# Patient Record
Sex: Male | Born: 1995
Health system: Southern US, Community
[De-identification: ages and names within clinical notes are randomized; demographics above are authoritative.]

## PROBLEM LIST (undated history)

## (undated) DIAGNOSIS — E079 Disorder of thyroid, unspecified: Secondary | ICD-10-CM

## (undated) DIAGNOSIS — J45909 Unspecified asthma, uncomplicated: Secondary | ICD-10-CM

## (undated) DIAGNOSIS — F329 Major depressive disorder, single episode, unspecified: Secondary | ICD-10-CM

## (undated) DIAGNOSIS — J3089 Other allergic rhinitis: Secondary | ICD-10-CM

## (undated) DIAGNOSIS — I861 Scrotal varices: Secondary | ICD-10-CM

## (undated) DIAGNOSIS — F32A Depression, unspecified: Secondary | ICD-10-CM

## (undated) DIAGNOSIS — Z8709 Personal history of other diseases of the respiratory system: Secondary | ICD-10-CM

## (undated) HISTORY — PX: WISDOM TOOTH EXTRACTION: SHX21

## (undated) HISTORY — DX: Scrotal varices: I86.1

## (undated) HISTORY — DX: Pure hyperglyceridemia: E78.1

## (undated) HISTORY — PX: TONSILLECTOMY: SUR1361

## (undated) HISTORY — PX: ADENOIDECTOMY: SUR15

## (undated) HISTORY — DX: Depression, unspecified: F32.A

## (undated) HISTORY — DX: Major depressive disorder, single episode, unspecified: F32.9

## (undated) HISTORY — DX: Other allergic rhinitis: J30.89

## (undated) HISTORY — DX: Unspecified asthma, uncomplicated: J45.909

## (undated) HISTORY — DX: Personal history of other diseases of the respiratory system: Z87.09

## (undated) HISTORY — DX: Disorder of thyroid, unspecified: E07.9

---
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
## 2016-05-29 ENCOUNTER — Other Ambulatory Visit (HOSPITAL_COMMUNITY): Payer: Self-pay | Admitting: Family Medicine

## 2016-05-29 DIAGNOSIS — N5082 Scrotal pain: Secondary | ICD-10-CM

## 2016-05-31 ENCOUNTER — Ambulatory Visit (HOSPITAL_COMMUNITY)
Admission: RE | Admit: 2016-05-31 | Discharge: 2016-05-31 | Disposition: A | Discharge status: Home / Self Care | Payer: BC Managed Care – PPO | Source: Ambulatory Visit | Attending: Family Medicine | Admitting: Family Medicine

## 2016-05-31 DIAGNOSIS — N5082 Scrotal pain: Secondary | ICD-10-CM | POA: Insufficient documentation

## 2017-01-15 IMAGING — US US ART/VEN ABD/PELV/SCROTUM DOPPLER LTD
1 series · 14 of 25 positions shown · non-contrast
Comparison: None.

CLINICAL DATA: Left scrotal pain.

EXAM:
SCROTAL ULTRASOUND
DOPPLER ULTRASOUND OF THE TESTICLES
TECHNIQUE: Complete ultrasound examination of the testicles, epididymis, and
other scrotal structures was performed. Color and spectral Doppler
ultrasound were also utilized to evaluate blood flow to the
testicles.

[Series 1: us art/ven abd/pelv/scrotum doppler ltd · 0.06mm/px · 14 of 68 slices shown]
[im 1/68]
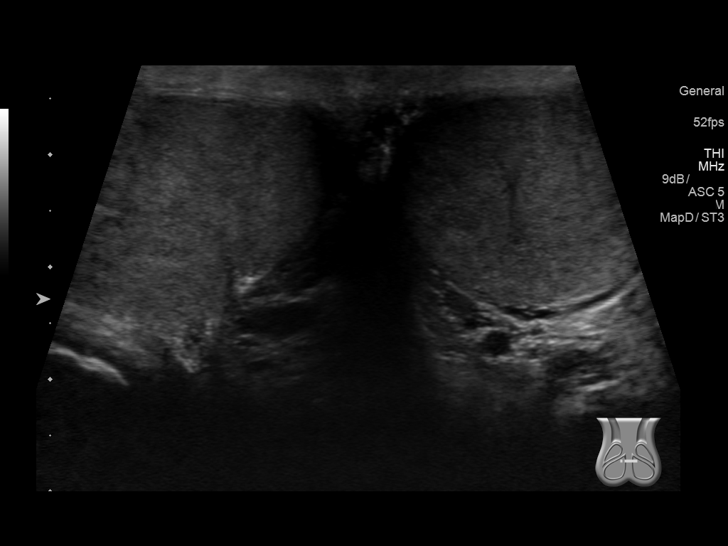
[im 6/68]
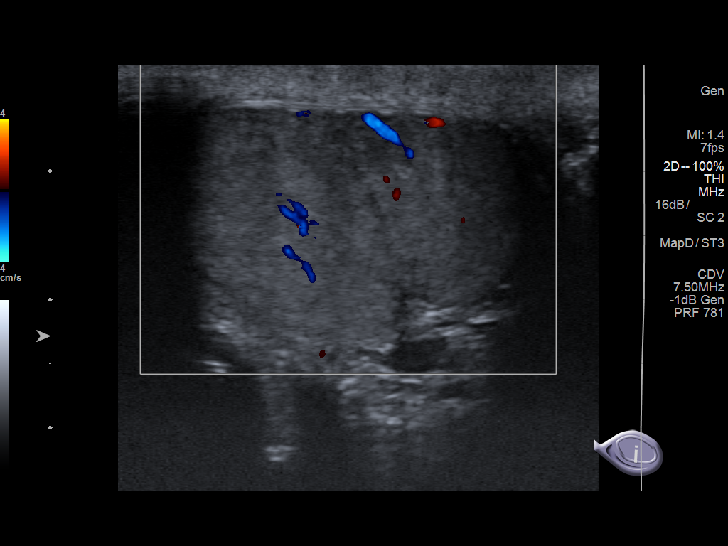
[im 12/68]
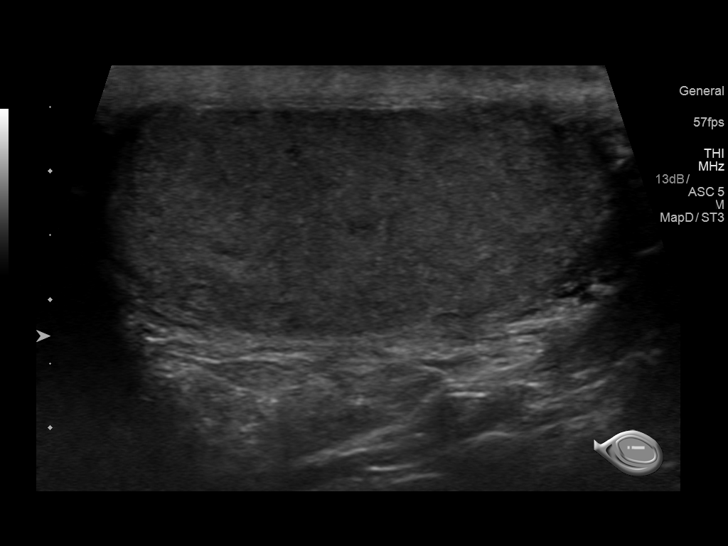
[im 17/68]
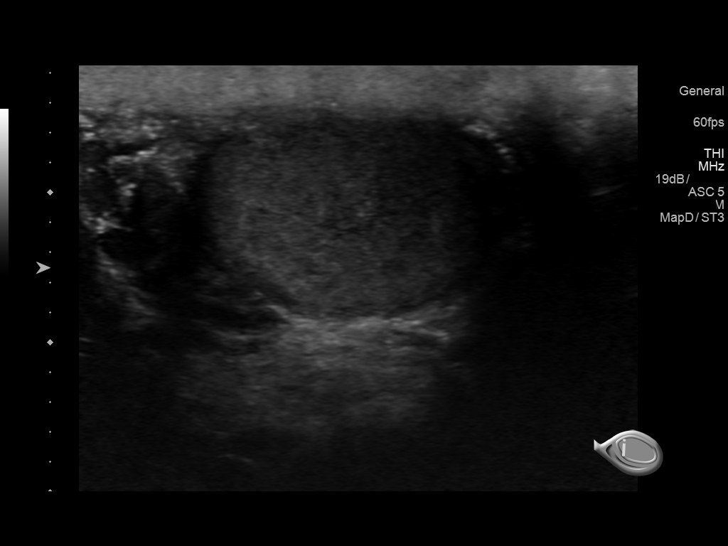
[im 23/68]
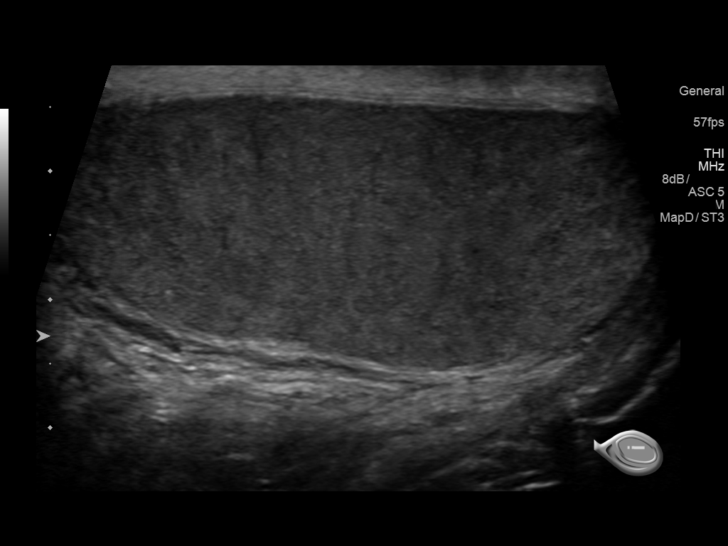
[im 26/68]
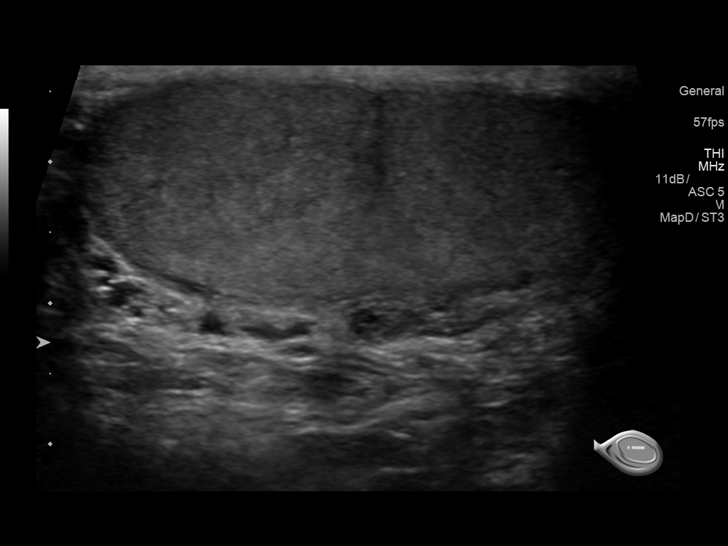
[im 31/68]
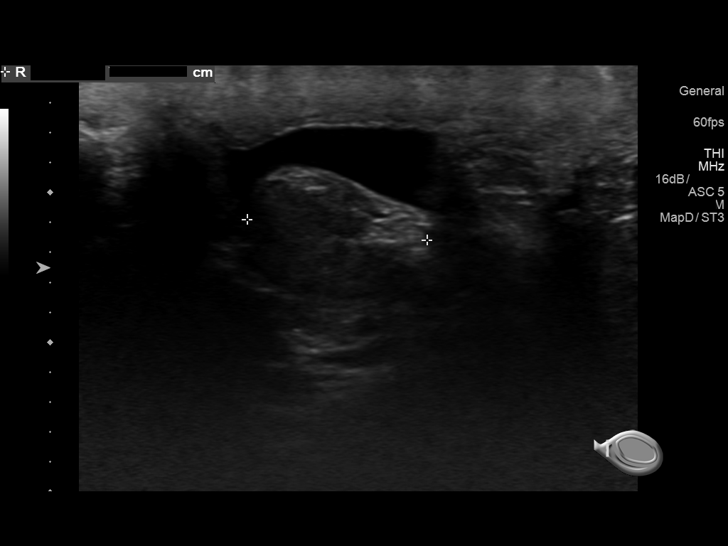
[im 37/68]
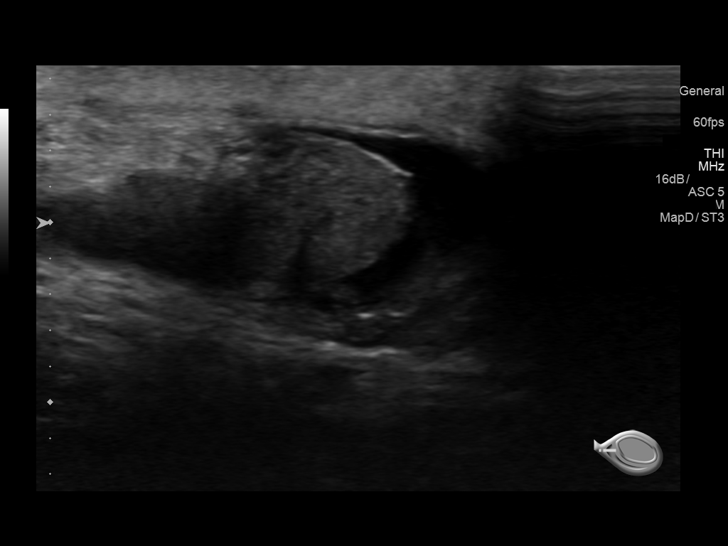
[im 42/68]
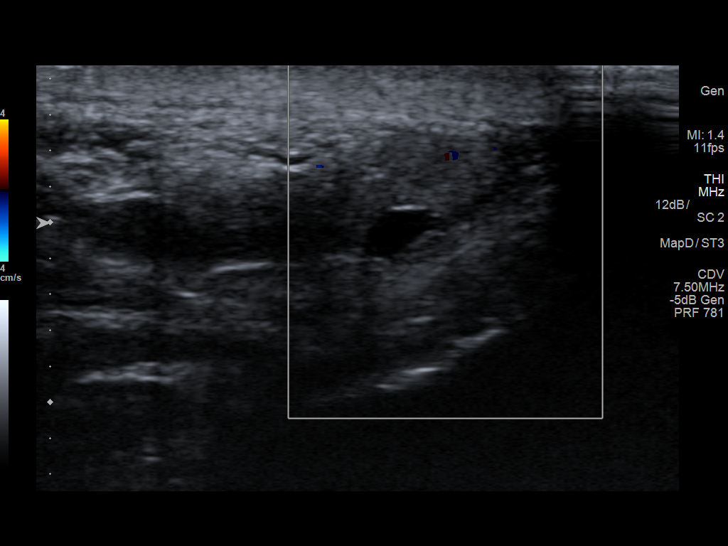
[im 45/68]
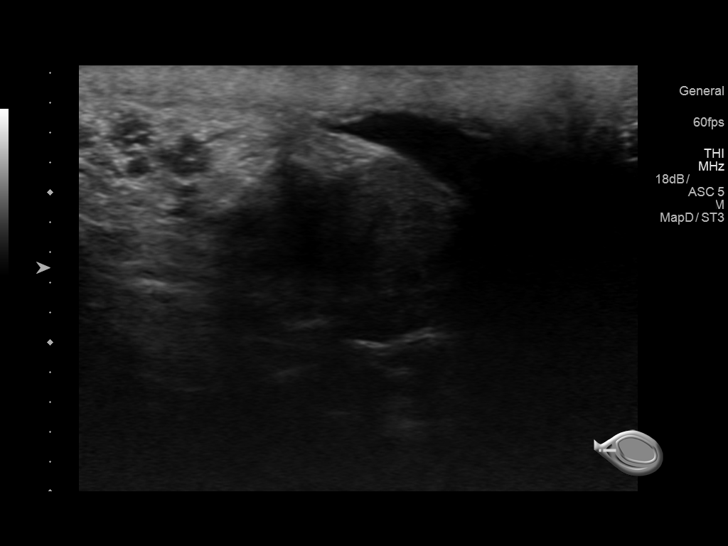
[im 51/68]
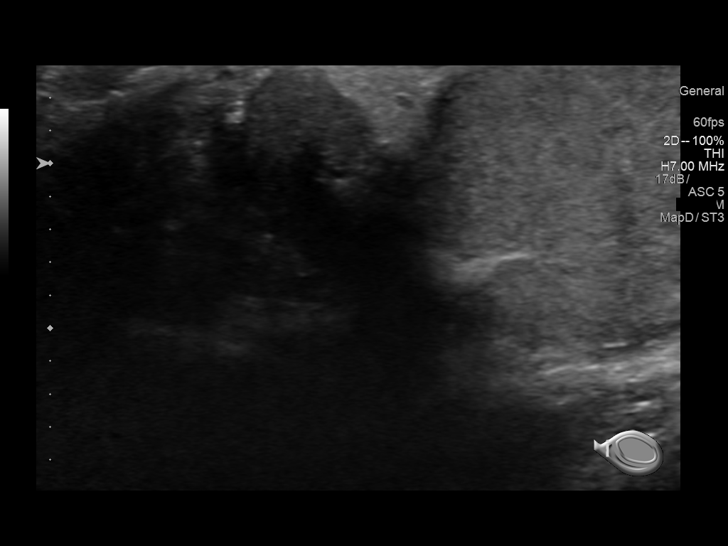
[im 56/68]
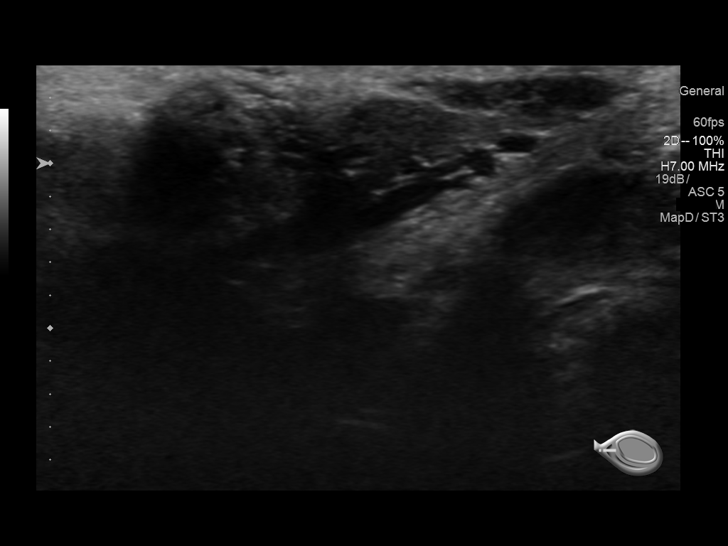
[im 62/68]
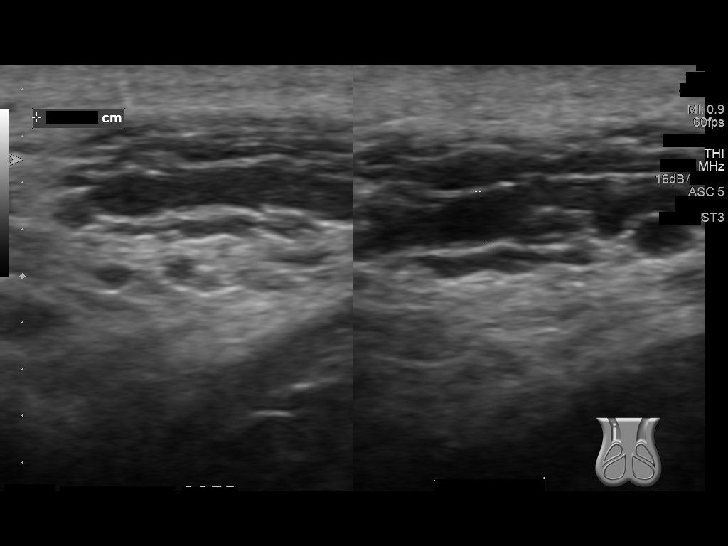
[im 68/68]
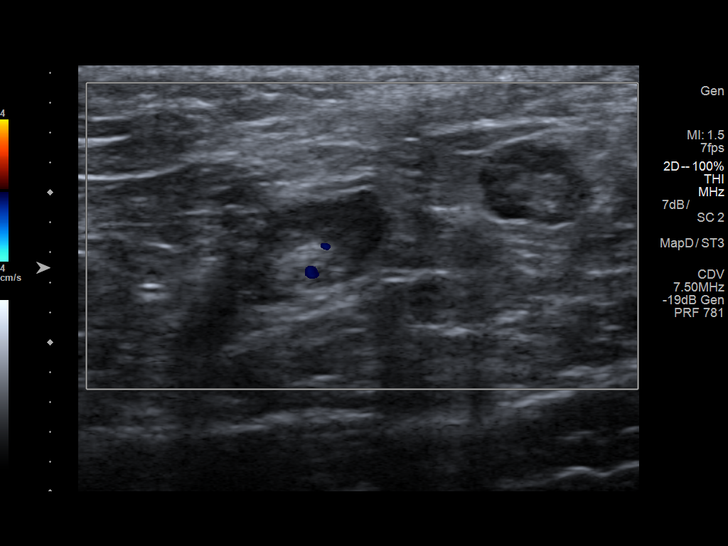

[14 of 25 positions shown; findings below may reference images not displayed]

FINDINGS: Right testicle

Measurements: 4.5 x 1.9 x 2.7 cm. No mass or microlithiasis
visualized.

Left testicle

Measurements: 4.7 x 2.1 x 2.8 cm. No mass or microlithiasis
visualized.

Right epididymis:  3 mm cyst.

Left epididymis:  Normal in size and appearance.

Hydrocele:  None visualized.

Varicocele:  Small left varicocele cannot be excluded.

Pulsed Doppler interrogation of both testes demonstrates normal low
resistance arterial and venous waveforms bilaterally.

Small left inguinal lymph nodes are noted.  Largest measures 1 cm .
IMPRESSION: 1.  Small left varicocele cannot be excluded.

2. No evidence of testicular mass or torsion.

3. Small left inguinal lymph nodes are noted. Largest measures 1 cm.

## 2017-11-21 ENCOUNTER — Encounter: Payer: Self-pay | Admitting: Family Medicine

## 2017-11-21 ENCOUNTER — Ambulatory Visit: Payer: BC Managed Care – PPO | Admitting: Family Medicine

## 2017-11-21 VITALS — BP 120/80 | HR 62 | Wt 218.4 lb

## 2017-11-21 DIAGNOSIS — Z8709 Personal history of other diseases of the respiratory system: Secondary | ICD-10-CM | POA: Diagnosis not present

## 2017-11-21 DIAGNOSIS — J3089 Other allergic rhinitis: Secondary | ICD-10-CM

## 2017-11-21 DIAGNOSIS — F329 Major depressive disorder, single episode, unspecified: Secondary | ICD-10-CM | POA: Diagnosis not present

## 2017-11-21 DIAGNOSIS — J4520 Mild intermittent asthma, uncomplicated: Secondary | ICD-10-CM | POA: Insufficient documentation

## 2017-11-21 DIAGNOSIS — F32A Depression, unspecified: Secondary | ICD-10-CM | POA: Insufficient documentation

## 2017-11-21 DIAGNOSIS — E079 Disorder of thyroid, unspecified: Secondary | ICD-10-CM | POA: Insufficient documentation

## 2017-11-21 DIAGNOSIS — I861 Scrotal varices: Secondary | ICD-10-CM | POA: Insufficient documentation

## 2017-11-21 DIAGNOSIS — Z79899 Other long term (current) drug therapy: Secondary | ICD-10-CM

## 2017-11-21 HISTORY — DX: Personal history of other diseases of the respiratory system: Z87.09

## 2017-11-21 HISTORY — DX: Scrotal varices: I86.1

## 2017-11-21 NOTE — Progress Notes (Signed)
Subjective:    Patient ID: Walter Spears, male    DOB: [birth year], 22 y.o.   MRN: [id number]  HPI Chief Complaint  Patient presents with  . new pt    new pt thryoid levels low and was put on med but no follow-up since   He is new to the practice and here to establish care.  Previous medical care: [facility] in [place]. Dr. Hilma Favors  Works in [place] and is moving from [place].  He is working 2 jobs and going to school but will graduate soon. He is an Chief Financial Officer. Single.   States he has been on Levothyroxine since May 2018. Requests recheck of thyroid function today.  States he was told his thyroid level was "low".  Reports being on same dose for the past 8 months and he is unsure of the dose. States he rarely misses doses. Takes medication with a full glass of water on an empty stomach.   Reports having ongoing tenderness to his right posterior neck at the base of his skull. This has been constant for at least one year without worsening.   Reports history of underlying allergies- takes Singulair and has since age 75. Reports year round allergies that are worse in the spring.  History of asthma since age 9 or 35. Well controlled. No albuterol inhaler in 3 years. No flare ups.  History of recurrent sinus infection. Last one was 3 weeks ago. States he gets 3 per year.  No ENT in the past.   History of depression, questionable anxiety. States he has been taking Lexapro for 4 years. Has sexual side effects but has tried to wean off the medication and his depression got worse.  Has had thoughts of suicide but never had a plan or attempted.  States he has never seen a therapist.  Reports his mother died with brain cancer and she never had any symptoms prior to her death. He is concerned and has been trying to get his father to get her biopsy reports but his father will not agree to this. He is frustrated about this.   Left testicle varicocele.   History of left hip pain.  States he had an [place]. No hernia. No XR of hip.  Right elbow with intermittent severe pain. Has been told he might need surgery and this is probably related to playing football in the past.  Orthopedist [facility] thinks this was in the [place] and [employer]. He was there 6 years ago for shoulder issues.   Smokes marijuana occasionally.  Drinks beer occasionally, mostly on the weekends.    Depression screen PHQ 2/9 11/21/2017  Decreased Interest 0  Down, Depressed, Hopeless 0  PHQ - 2 Score 0    Reviewed allergies, medications, past medical, surgical, family, and social history.    Review of Systems Pertinent positives and negatives in the history of present illness.     Objective:   Physical Exam BP 120/80   Pulse 62   Wt 218 lb 6.4 oz (99.1 kg)  Alert and in no distress. No sinus tenderness.  Pharyngeal area is normal. Neck is supple without adenopathy or thyromegaly. Cardiac exam shows a regular sinus rhythm without murmurs or gallops. Lungs are clear to auscultation.      Assessment & Plan:  Thyroid disease - Plan: CBC with Differential/Platelet, Comprehensive metabolic panel, TSH, T3, T4, free  Environmental and seasonal allergies  Mild intermittent asthma without complication  Depression, unspecified depression type - Plan: CBC with  Differential/Platelet, Comprehensive metabolic panel, TSH  Medication management - Plan: TSH, T3, T4, free  History of sinusitis  He is a pleasant young male. Appears slightly confused regarding his thyroid disorder. We will attempt to get his medical records from his previous PCP. Called the pharmacy and he is taking 50 mcg of Synthroid. We will check his thyroid function and adjust medication as needed. Asthma and allergies appear to be under good control.  History of sinusitis with his last infection 3 weeks ago. Symptoms resolved.   He is not suicidal or homicidal.  He declines counseling for depression. He feels like he cannot wean  off Lexapro since he has tried and his mood worsened. We may want to consider switching him to another drug such as Trintellix in the future. Advised that marijuana and alcohol alter mood and I recommend that he stop using these.  He thinks his last CPE was May 2018. We will follow up pending labs and I will have him return after I receive his medical records and discuss possibly switching antidepressants.

## 2017-11-22 ENCOUNTER — Other Ambulatory Visit: Payer: Self-pay | Admitting: Family Medicine

## 2017-11-22 DIAGNOSIS — E079 Disorder of thyroid, unspecified: Secondary | ICD-10-CM

## 2017-11-22 LAB — CBC WITH DIFFERENTIAL/PLATELET
BASOS: 0 %
Basophils Absolute: 0 10*3/uL (ref 0.0–0.2)
EOS (ABSOLUTE): 0.1 10*3/uL (ref 0.0–0.4)
EOS: 1 %
HEMATOCRIT: 46.1 % (ref 37.5–51.0)
Hemoglobin: 16 g/dL (ref 13.0–17.7)
IMMATURE GRANULOCYTES: 0 %
Immature Grans (Abs): 0 10*3/uL (ref 0.0–0.1)
Lymphocytes Absolute: 1.8 10*3/uL (ref 0.7–3.1)
Lymphs: 28 %
MCH: 30.4 pg (ref 26.6–33.0)
MCHC: 34.7 g/dL (ref 31.5–35.7)
MCV: 88 fL (ref 79–97)
Monocytes Absolute: 0.5 10*3/uL (ref 0.1–0.9)
Monocytes: 9 %
NEUTROS PCT: 62 %
Neutrophils Absolute: 3.8 10*3/uL (ref 1.4–7.0)
PLATELETS: 305 10*3/uL (ref 150–379)
RBC: 5.27 x10E6/uL (ref 4.14–5.80)
RDW: 14 % (ref 12.3–15.4)
WBC: 6.2 10*3/uL (ref 3.4–10.8)

## 2017-11-22 LAB — COMPREHENSIVE METABOLIC PANEL
ALT: 21 IU/L (ref 0–44)
AST: 20 IU/L (ref 0–40)
Albumin/Globulin Ratio: 2.2 (ref 1.2–2.2)
Albumin: 5.1 g/dL (ref 3.5–5.5)
Alkaline Phosphatase: 61 IU/L (ref 39–117)
BILIRUBIN TOTAL: 0.6 mg/dL (ref 0.0–1.2)
BUN / CREAT RATIO: 8 — AB (ref 9–20)
BUN: 8 mg/dL (ref 6–20)
CALCIUM: 9.7 mg/dL (ref 8.7–10.2)
CO2: 24 mmol/L (ref 20–29)
CREATININE: 0.97 mg/dL (ref 0.76–1.27)
Chloride: 99 mmol/L (ref 96–106)
GFR calc Af Amer: 127 mL/min/{1.73_m2} (ref >59)
GFR calc non Af Amer: 110 mL/min/{1.73_m2} (ref >59)
GLOBULIN, TOTAL: 2.3 g/dL (ref 1.5–4.5)
Glucose: 85 mg/dL (ref 65–99)
Potassium: 4.4 mmol/L (ref 3.5–5.2)
SODIUM: 140 mmol/L (ref 134–144)
TOTAL PROTEIN: 7.4 g/dL (ref 6.0–8.5)

## 2017-11-22 LAB — TSH: TSH: 2.48 u[IU]/mL (ref 0.450–4.500)

## 2017-11-22 LAB — T3: T3, Total: 110 ng/dL (ref 71–180)

## 2017-11-22 LAB — T4, FREE: Free T4: 1.3 ng/dL (ref 0.82–1.77)

## 2017-12-18 ENCOUNTER — Encounter: Payer: Self-pay | Admitting: Family Medicine

## 2017-12-18 ENCOUNTER — Ambulatory Visit: Payer: BC Managed Care – PPO | Admitting: Family Medicine

## 2017-12-18 VITALS — BP 120/80 | HR 72 | Temp 98.3°F | Resp 16 | Wt 221.8 lb

## 2017-12-18 DIAGNOSIS — J0141 Acute recurrent pansinusitis: Secondary | ICD-10-CM

## 2017-12-18 MED ORDER — PREDNISONE 10 MG (21) PO TBPK
ORAL_TABLET | Freq: Every day | ORAL | 0 refills | Status: DC
Start: 2017-12-18 — End: 2018-04-21

## 2017-12-18 MED ORDER — AMOXICILLIN-POT CLAVULANATE 875-125 MG PO TABS: 1.0000 | ORAL_TABLET | Freq: Two times a day (BID) | ORAL | 0 refills | Status: DC

## 2017-12-18 NOTE — Progress Notes (Signed)
Chief Complaint  Patient presents with  . sinus infection    2 days, sinus pressure, headache    Subjective:  Walter Spears is a 22 y.o. male who presents for a 2 -3 day history of sinus pain, headache, and severe nasal congestion.  Has not smoked in 2 months.   States he gets 4 sinus infections per year. States last one was in January. States he is miserable and tired of getting these. He is requesting a referral to allergist or ENT. Denies history of allergies.  He is using neti pot.   Denies fever, chills, ear pain, sore throat, chest pain, palpitations,  cough, shortness of breath, abdominal pain, N/V/D.   Treatment to date: ibuprofen and Benadryl.  Denies sick contacts.  No other aggravating or relieving factors.  No other c/o.  ROS as in subjective.   Objective: Vitals:   12/18/17 0943  BP: 120/80  Pulse: 72  Resp: 16  Temp: 98.3 F (36.8 C)  SpO2: 98%    General appearance: Alert, WD/WN, no distress, mildly ill appearing                             Skin: warm, no rash                           Head: + frontal, maxillary, ethmoid sinus tenderness                            Eyes: conjunctiva normal, corneas clear, PERRLA                            Ears: pearly TMs, external ear canals normal                          Nose: septum midline, turbinates swollen, with erythema and thick discharge L>R             Mouth/throat: MMM, tongue normal, mild pharyngeal erythema                           Neck: supple, no adenopathy, no thyromegaly, nontender                          Heart: RRR, normal S1, S2, no murmurs                         Lungs: CTA bilaterally, no wheezes, rales, or rhonchi      Assessment: Acute recurrent pansinusitis - Plan: amoxicillin-clavulanate (AUGMENTIN) 875-125 MG tablet, predniSONE (STERAPRED UNI-PAK 21 TAB) 10 MG (21) TBPK tablet, Ambulatory referral to ENT    Plan: Discussed diagnosis and treatment of acute recurrent sinusitis. Augmentin  and steroid dose pak prescribed. Referral to ENT per patient request.   Suggested symptomatic OTC remedies. Nasal saline spray for congestion.  Tylenol or Ibuprofen OTC for fever and malaise.  Call/return as needed.

## 2018-01-02 DIAGNOSIS — J329 Chronic sinusitis, unspecified: Secondary | ICD-10-CM | POA: Insufficient documentation

## 2018-01-09 ENCOUNTER — Telehealth: Payer: Self-pay | Admitting: Family Medicine

## 2018-01-09 MED ORDER — ESCITALOPRAM OXALATE 10 MG PO TABS: 10.0000 mg | ORAL_TABLET | Freq: Every day | ORAL | 5 refills | Status: DC

## 2018-01-09 NOTE — Telephone Encounter (Signed)
Pt called and is requesting a refill on his lexapro pt uses [facility] [phone] - [address] [place], [address] AT [address] pt can be reached at [phone]

## 2018-01-09 NOTE — Telephone Encounter (Signed)
Sent to pharmacy 

## 2018-01-09 NOTE — Telephone Encounter (Signed)
Ok to refill 

## 2018-02-17 ENCOUNTER — Other Ambulatory Visit: Payer: BC Managed Care – PPO

## 2018-02-17 DIAGNOSIS — E079 Disorder of thyroid, unspecified: Secondary | ICD-10-CM

## 2018-02-18 ENCOUNTER — Other Ambulatory Visit: Payer: Self-pay | Admitting: Family Medicine

## 2018-02-18 LAB — TSH: TSH: 1.65 u[IU]/mL (ref 0.450–4.500)

## 2018-02-18 MED ORDER — SYNTHROID 50 MCG PO TABS: 50.0000 ug | ORAL_TABLET | Freq: Every day | ORAL | 5 refills | Status: DC

## 2018-04-20 NOTE — Progress Notes (Signed)
Subjective:    Patient ID: Walter Spears, male    DOB: 1996/07/23, 22 y.o.   MRN: [id number]  HPI Chief Complaint  Patient presents with  . physical    physical- coffee with alittle cream and sugar   He is here for a complete physical exam. Previous medical care: [facility] in [place] Last CPE: May 2018   He would like to have moles on his shoulders checked.   Other providers: ENT- recurrent sinusitis   Using Flonase and singulair.  Has not had to use albuterol inhaler. Asthma is not that bothersome. Not using it regularly. Mainly when he is sick.   Taking synthroid daily on empty stomach.   Lexapro daily. He is taking 1/2 tablet every other day and mood is stable.   Social history: Lives with family, works for [employer] and for [employer]  Denies smoking. Alcohol only on weekends. Smokes marijuana occasionally.  Diet: he has cut back on fatty foods and sugar  Exercise: 3-4 days a week  Immunizations: Tdap 2010 or 2011  HPV - has not had this and declines today   Health maintenance:  Colonoscopy: never  Last PSA: never  Last Dental Exam: twice annually  Last Eye Exam: 2005   Wears seatbelt always, uses sunscreen, smoke detectors in home and functioning, does not text while driving, feels safe in home environment.  Reviewed allergies, medications, past medical, surgical, family, and social history.   Review of Systems Review of Systems Constitutional: -fever, -chills, -sweats, -unexpected weight change,-fatigue ENT: -runny nose, -ear pain, -sore throat Cardiology:  -chest pain, -palpitations, -edema Respiratory: -cough, -shortness of breath, -wheezing Gastroenterology: -abdominal pain, -nausea, -vomiting, -diarrhea, -constipation  Hematology: -bleeding or bruising problems Musculoskeletal: -arthralgias, -myalgias, -joint swelling, -back pain Ophthalmology: -vision changes Urology: -dysuria, -difficulty urinating, -hematuria, -urinary frequency,  -urgency Neurology: -headache, -weakness, -tingling, -numbness       Objective:   Physical Exam BP 120/80   Pulse 64   Ht 5' 6.25" (1.683 m)   Wt 219 lb 12.8 oz (99.7 kg)   BMI 35.21 kg/m   General Appearance:    Alert, cooperative, no distress, appears stated age  Head:    Normocephalic, without obvious abnormality, atraumatic  Eyes:    PERRL, conjunctiva/corneas clear, EOM's intact, fundi    benign  Ears:    Normal TM's and external ear canals  Nose:   Nares normal, mucosa normal, no drainage or sinus   tenderness  Throat:   Lips, mucosa, and tongue normal; teeth and gums normal  Neck:   Supple, no lymphadenopathy;  thyroid:  no   enlargement/tenderness/nodules; no carotid   bruit or JVD  Back:    Spine nontender, no curvature, ROM normal, no CVA     tenderness  Lungs:     Clear to auscultation bilaterally without wheezes, rales or     ronchi; respirations unlabored  Chest Wall:    No tenderness or deformity   Heart:    Regular rate and rhythm, S1 and S2 normal, no murmur, rub   or gallop  Breast Exam:    No chest wall tenderness, masses or gynecomastia  Abdomen:     Soft, non-tender, nondistended, normoactive bowel sounds,    no masses, no hepatosplenomegaly  Genitalia:    Normal male external genitalia without lesions.  Testicles without masses.  No inguinal hernias.  Rectal:   Deferred due to age <40 and lack of symptoms  Extremities:   No clubbing, cyanosis or edema  Pulses:   2+ and symmetric all extremities  Skin:   Skin color, texture, turgor normal, no rashes or lesions. Multiple nevi without any concerning on his shoulders or upper torso.   Lymph nodes:   Cervical, supraclavicular, and axillary nodes normal  Neurologic:   CNII-XII intact, normal strength, sensation and gait; reflexes 2+ and symmetric throughout          Psych:   Normal mood, affect, hygiene and grooming.     Urinalysis dipstick: negative       Assessment & Plan:  Routine general medical  examination at a health care facility - Plan: POCT Urinalysis DIP (Proadvantage Device), Lipid panel  Mild intermittent asthma without complication  Depression, unspecified depression type  Environmental and seasonal allergies  Thyroid disease  Need for Tdap vaccination - Plan: Tdap vaccine greater than or equal to 7yo IM  Multiple nevi  Screening for lipid disorders - Plan: Lipid panel  He appears to be doing fine overall.  His mood appears to be stable and he is taking his medication differently than prescribed.  He is taking a half a tablet of Lexapro every other day and a full tablet every other day.  He is treating Allergies with Flonase and Singulair. He is not using albuterol inhaler.  Asthma appears to be well controlled. Immunizations discussed.  He declines HPV vaccine. Tdap given and counseling done on all components of the vaccine as well as side effects. Reviewed recent lab results with patient including his TSH.  This is stable.  He will continue on the current dose of Synthroid. Discussed self testicular exams and the risk of testicular cancer in his age group. Check lipids and follow-up.

## 2018-04-21 ENCOUNTER — Encounter: Payer: Self-pay | Admitting: Family Medicine

## 2018-04-21 ENCOUNTER — Ambulatory Visit (INDEPENDENT_AMBULATORY_CARE_PROVIDER_SITE_OTHER): Payer: BC Managed Care – PPO | Admitting: Family Medicine

## 2018-04-21 VITALS — BP 120/80 | HR 64 | Ht 66.25 in | Wt 219.8 lb

## 2018-04-21 DIAGNOSIS — Z13220 Encounter for screening for lipoid disorders: Secondary | ICD-10-CM

## 2018-04-21 DIAGNOSIS — D229 Melanocytic nevi, unspecified: Secondary | ICD-10-CM

## 2018-04-21 DIAGNOSIS — Z0000 Encounter for general adult medical examination without abnormal findings: Secondary | ICD-10-CM

## 2018-04-21 DIAGNOSIS — F32A Depression, unspecified: Secondary | ICD-10-CM

## 2018-04-21 DIAGNOSIS — Z23 Encounter for immunization: Secondary | ICD-10-CM | POA: Diagnosis not present

## 2018-04-21 DIAGNOSIS — J3089 Other allergic rhinitis: Secondary | ICD-10-CM

## 2018-04-21 DIAGNOSIS — F329 Major depressive disorder, single episode, unspecified: Secondary | ICD-10-CM | POA: Diagnosis not present

## 2018-04-21 DIAGNOSIS — E079 Disorder of thyroid, unspecified: Secondary | ICD-10-CM

## 2018-04-21 DIAGNOSIS — J4520 Mild intermittent asthma, uncomplicated: Secondary | ICD-10-CM

## 2018-04-21 LAB — POCT URINALYSIS DIP (PROADVANTAGE DEVICE)
Bilirubin, UA: NEGATIVE
Blood, UA: NEGATIVE
Glucose, UA: NEGATIVE mg/dL
Ketones, POC UA: NEGATIVE mg/dL
Leukocytes, UA: NEGATIVE
Nitrite, UA: NEGATIVE
Protein Ur, POC: NEGATIVE mg/dL
Specific Gravity, Urine: 1.01
Urobilinogen, Ur: NEGATIVE
pH, UA: 6 (ref 5.0–8.0)

## 2018-04-21 LAB — LIPID PANEL
Chol/HDL Ratio: 4.5 ratio (ref 0.0–5.0)
Cholesterol, Total: 181 mg/dL (ref 100–199)
HDL: 40 mg/dL (ref >39)
LDL Calculated: 99 mg/dL (ref 0–99)
Triglycerides: 209 mg/dL — ABNORMAL HIGH (ref 0–149)
VLDL Cholesterol Cal: 42 mg/dL — ABNORMAL HIGH (ref 5–40)

## 2018-04-21 NOTE — Patient Instructions (Signed)
You received a Tdap today.   Look up the HPV virus on the CDC website or another reliable source. Let us know if you would like to get this. You can schedule a nurse visit for this.   Continue on your current medication.  Make sure you are getting at least 150 minutes of physical activity per week.   Follow up in 6 months.    Preventative Care for Adults, Male       REGULAR HEALTH EXAMS:  A routine yearly physical is a good way to check in with your primary care provider about your health and preventive screening. It is also an opportunity to share updates about your health and any concerns you have, and receive a thorough all-over exam.   Most health insurance companies pay for at least some preventative services.  Check with your health plan for specific coverages.  WHAT PREVENTATIVE SERVICES DO MEN NEED?  Adult men should have their weight and blood pressure checked regularly.   Men age 69 and older should have their cholesterol levels checked regularly.  Beginning at age 46 and continuing to age 22, men should be screened for colorectal cancer.  Certain people should may need continued testing until age 63.  Other cancer screening may include exams for testicular and prostate cancer.  Updating vaccinations is part of preventative care.  Vaccinations help protect against diseases such as the flu.  Lab tests are generally done as part of preventative care to screen for anemia and blood disorders, to screen for problems with the kidneys and liver, to screen for bladder problems, to check blood sugar, and to check your cholesterol level.  Preventative services generally include counseling about diet, exercise, avoiding tobacco, drugs, excessive alcohol consumption, and sexually transmitted infections.    GENERAL RECOMMENDATIONS FOR GOOD HEALTH:  Healthy diet:  Eat a variety of foods, including fruit, vegetables, animal or vegetable protein, such as meat, fish, chicken, and eggs,  or beans, lentils, tofu, and grains, such as rice.  Drink plenty of water daily.  Decrease saturated fat in the diet, avoid lots of red meat, processed foods, sweets, fast foods, and fried foods.  Exercise:  Aerobic exercise helps maintain good heart health. At least 30-40 minutes of moderate-intensity exercise is recommended. For example, a brisk walk that increases your heart rate and breathing. This should be done on most days of the week.   Find a type of exercise or a variety of exercises that you enjoy so that it becomes a part of your daily life.  Examples are running, walking, swimming, water aerobics, and biking.  For motivation and support, explore group exercise such as aerobic class, spin class, Zumba, Yoga,or  martial arts, etc.    Set exercise goals for yourself, such as a certain weight goal, walk or run in a race such as a 5k walk/run.  Speak to your primary care provider about exercise goals.  Disease prevention:  If you smoke or chew tobacco, find out from your caregiver how to quit. It can literally save your life, no matter how long you have been a tobacco user. If you do not use tobacco, never begin.   Maintain a healthy diet and normal weight. Increased weight leads to problems with blood pressure and diabetes.   The Body Mass Index or BMI is a way of measuring how much of your body is fat. Having a BMI above 27 increases the risk of heart disease, diabetes, hypertension, stroke and other problems related  to obesity. Your caregiver can help determine your BMI and based on it develop an exercise and dietary program to help you achieve or maintain this important measurement at a healthful level.  High blood pressure causes heart and blood vessel problems.  Persistent high blood pressure should be treated with medicine if weight loss and exercise do not work.   Fat and cholesterol leaves deposits in your arteries that can block them. This causes heart disease and vessel  disease elsewhere in your body.  If your cholesterol is found to be high, or if you have heart disease or certain other medical conditions, then you may need to have your cholesterol monitored frequently and be treated with medication.   Ask if you should have a stress test if your history suggests this. A stress test is a test done on a treadmill that looks for heart disease. This test can find disease prior to there being a problem.  Avoid drinking alcohol in excess (more than two drinks per day).  Avoid use of street drugs. Do not share needles with anyone. Ask for professional help if you need assistance or instructions on stopping the use of alcohol, cigarettes, and/or drugs.  Brush your teeth twice a day with fluoride toothpaste, and floss once a day. Good oral hygiene prevents tooth decay and gum disease. The problems can be painful, unattractive, and can cause other health problems. Visit your dentist for a routine oral and dental check up and preventive care every 6-12 months.   Look at your skin regularly.  Use a mirror to look at your back. Notify your caregivers of changes in moles, especially if there are changes in shapes, colors, a size larger than a pencil eraser, an irregular border, or development of new moles.  Safety:  Use seatbelts 100% of the time, whether driving or as a passenger.  Use safety devices such as hearing protection if you work in environments with loud noise or significant background noise.  Use safety glasses when doing any work that could send debris in to the eyes.  Use a helmet if you ride a bike or motorcycle.  Use appropriate safety gear for contact sports.  Talk to your caregiver about gun safety.  Use sunscreen with a SPF (or skin protection factor) of 15 or greater.  Lighter skinned people are at a greater risk of skin cancer. Don't forget to also wear sunglasses in order to protect your eyes from too much damaging sunlight. Damaging sunlight can accelerate  cataract formation.   Practice safe sex. Use condoms. Condoms are used for birth control and to help reduce the spread of sexually transmitted infections (or STIs).  Some of the STIs are gonorrhea (the clap), chlamydia, syphilis, trichomonas, herpes, HPV (human papilloma virus) and HIV (human immunodeficiency virus) which causes AIDS. The herpes, HIV and HPV are viral illnesses that have no cure. These can result in disability, cancer and death.   Keep carbon monoxide and smoke detectors in your home functioning at all times. Change the batteries every 6 months or use a model that plugs into the wall.   Vaccinations:  Stay up to date with your tetanus shots and other required immunizations. You should have a booster for tetanus every 10 years. Be sure to get your flu shot every year, since 5%-20% of the [place] population comes down with the flu. The flu vaccine changes each year, so being vaccinated once is not enough. Get your shot in the fall, before the flu  season peaks.   Other vaccines to consider:  Pneumococcal vaccine to protect against certain types of pneumonia.  This is normally recommended for adults age 64 or older.  However, adults younger than 24 years old with certain underlying conditions such as diabetes, heart or lung disease should also receive the vaccine.  Shingles vaccine to protect against Varicella Zoster if you are older than age 29, or younger than 24 years old with certain underlying illness.  Hepatitis A vaccine to protect against a form of infection of the liver by a virus acquired from food.  Hepatitis B vaccine to protect against a form of infection of the liver by a virus acquired from blood or body fluids, particularly if you work in health care.  If you plan to travel internationally, check with your local health department for specific vaccination recommendations.  Cancer Screening:  Most routine colon cancer screening begins at the age of 70. On a yearly  basis, doctors may provide special easy to use take-home tests to check for hidden blood in the stool. Sigmoidoscopy or colonoscopy can detect the earliest forms of colon cancer and is life saving. These tests use a small camera at the end of a tube to directly examine the colon. Speak to your caregiver about this at age 79, when routine screening begins (and is repeated every 5 years unless early forms of pre-cancerous polyps or small growths are found).   At the age of 41 men usually start screening for prostate cancer every year. Screening may begin at a younger age for those with higher risk. Those at higher risk include African-Americans or having a family history of prostate cancer. There are two types of tests for prostate cancer:   Prostate-specific antigen (PSA) testing. Recent studies raise questions about prostate cancer using PSA and you should discuss this with your caregiver.   Digital rectal exam (in which your doctor's lubricated and gloved finger feels for enlargement of the prostate through the anus).   Screening for testicular cancer.  Do a monthly exam of your testicles. Gently roll each testicle between your thumb and fingers, feeling for any abnormal lumps. The best time to do this is after a hot shower or bath when the tissues are looser. Notify your caregivers of any lumps, tenderness or changes in size or shape immediately.

## 2018-07-20 ENCOUNTER — Other Ambulatory Visit: Payer: Self-pay | Admitting: Family Medicine

## 2018-07-21 NOTE — Telephone Encounter (Signed)
Left message for pt to call back to schedule a follow-up in January and we can refill med

## 2018-07-21 NOTE — Telephone Encounter (Signed)
Is this okay to refill? 

## 2018-07-21 NOTE — Telephone Encounter (Signed)
Ok to refill until follow up, should be in 3 months.

## 2018-08-20 ENCOUNTER — Telehealth: Payer: Self-pay | Admitting: Family Medicine

## 2018-08-20 MED ORDER — SYNTHROID 50 MCG PO TABS: 50.0000 ug | ORAL_TABLET | Freq: Every day | ORAL | 2 refills | Status: DC

## 2018-08-20 NOTE — Telephone Encounter (Signed)
done

## 2018-08-20 NOTE — Telephone Encounter (Signed)
Ok to refill 

## 2018-08-20 NOTE — Telephone Encounter (Signed)
Pt has appt scheduled 10/17/18 and needs refill Synthroid to [facility]

## 2018-09-26 ENCOUNTER — Other Ambulatory Visit: Payer: Self-pay | Admitting: Family Medicine

## 2018-10-01 ENCOUNTER — Other Ambulatory Visit: Payer: Self-pay | Admitting: Family Medicine

## 2018-10-17 ENCOUNTER — Ambulatory Visit: Payer: BC Managed Care – PPO | Admitting: Family Medicine

## 2018-10-23 ENCOUNTER — Other Ambulatory Visit: Payer: Self-pay | Admitting: Family Medicine

## 2018-10-31 ENCOUNTER — Encounter: Payer: Self-pay | Admitting: Family Medicine

## 2018-10-31 ENCOUNTER — Ambulatory Visit: Payer: BC Managed Care – PPO | Admitting: Family Medicine

## 2018-10-31 VITALS — BP 128/80 | HR 77 | Wt 227.0 lb

## 2018-10-31 DIAGNOSIS — J4520 Mild intermittent asthma, uncomplicated: Secondary | ICD-10-CM

## 2018-10-31 DIAGNOSIS — J45990 Exercise induced bronchospasm: Secondary | ICD-10-CM

## 2018-10-31 DIAGNOSIS — J309 Allergic rhinitis, unspecified: Secondary | ICD-10-CM

## 2018-10-31 DIAGNOSIS — E079 Disorder of thyroid, unspecified: Secondary | ICD-10-CM | POA: Diagnosis not present

## 2018-10-31 MED ORDER — MONTELUKAST SODIUM 10 MG PO TABS: 10.0000 mg | ORAL_TABLET | Freq: Every day | ORAL | 3 refills | Status: DC

## 2018-10-31 MED ORDER — SYNTHROID 50 MCG PO TABS: ORAL_TABLET | ORAL | 1 refills | Status: DC

## 2018-10-31 NOTE — Progress Notes (Signed)
   Subjective:    Patient ID: Walter Spears, male    DOB: Mar 01, 1996, 23 y.o.   MRN: [id number]  HPI He is here for a med check appointment.  He does have underlying allergies and is on Flonase as well as Singulair and seems to be doing well on that.  He also has a history of exercise-induced asthma but has not needed it for that in quite some time.  He would like to continue on Singulair.  He is also taking thyroid medicine and having no difficulty with that. He has a previous history of anxiety and depression however he is tapered himself down to 5 mg every other day of Lexapro.  He did this mainly because his anxiety seems to be under better control and it was causing ejaculatory issues with him.  Review of Systems     Objective:   Physical Exam Alert and in no distress otherwise not examined       Assessment & Plan:  Thyroid disease - Plan: SYNTHROID 50 MCG tablet  Mild intermittent asthma without complication - Plan: montelukast (SINGULAIR) 10 MG tablet  Allergic rhinitis, unspecified seasonality, unspecified trigger - Plan: montelukast (SINGULAIR) 10 MG tablet  Exercise-induced asthma Recommend he stop taking the Lexapro entirely however he notes difficulty with anxiety, have him come in for further discussion for medication and possible counseling.  He will continue on Singulair and Flonase.  Discussed the use of the inhaler for exercise-induced asthma.  6 months of thyroid given.  He is to return in for repeat blood work.

## 2018-11-10 ENCOUNTER — Ambulatory Visit: Payer: BC Managed Care – PPO | Admitting: Family Medicine

## 2018-12-22 ENCOUNTER — Other Ambulatory Visit: Payer: Self-pay | Admitting: Family Medicine

## 2019-01-12 ENCOUNTER — Telehealth: Payer: BC Managed Care – PPO | Admitting: Family

## 2019-01-12 DIAGNOSIS — M5440 Lumbago with sciatica, unspecified side: Secondary | ICD-10-CM | POA: Diagnosis not present

## 2019-01-12 MED ORDER — CYCLOBENZAPRINE HCL 10 MG PO TABS: 10.0000 mg | ORAL_TABLET | Freq: Three times a day (TID) | ORAL | 0 refills | Status: DC | PRN

## 2019-01-12 NOTE — Progress Notes (Signed)

## 2019-05-04 ENCOUNTER — Encounter: Payer: Self-pay | Admitting: Family Medicine

## 2019-05-04 ENCOUNTER — Other Ambulatory Visit: Payer: Self-pay

## 2019-05-04 ENCOUNTER — Ambulatory Visit: Payer: BC Managed Care – PPO | Admitting: Family Medicine

## 2019-05-04 VITALS — BP 130/90 | HR 80 | Temp 98.2°F | Ht 68.0 in | Wt 227.8 lb

## 2019-05-04 DIAGNOSIS — M62838 Other muscle spasm: Secondary | ICD-10-CM | POA: Diagnosis not present

## 2019-05-04 DIAGNOSIS — M7918 Myalgia, other site: Secondary | ICD-10-CM

## 2019-05-04 MED ORDER — MELOXICAM 15 MG PO TABS: 15.0000 mg | ORAL_TABLET | Freq: Every day | ORAL | 0 refills | Status: DC

## 2019-05-04 MED ORDER — CARISOPRODOL 350 MG PO TABS: 350.0000 mg | ORAL_TABLET | Freq: Four times a day (QID) | ORAL | 0 refills | Status: DC | PRN

## 2019-05-04 NOTE — Patient Instructions (Signed)
Take meloxicam once daily with food.  Take this until your pain has completely gone (may only need to be 7-10 days), or you can take the full 2 weeks, if needed. Use the Soma as needed for muscle spasm. Do not mix this with alcohol. I recommend heat followed by stretches (and massage, if possible) at least 3 times daily. Try topical medications/patches--ie Biofreeze or IcyHot, or SalonPas (with lidocaine).  I highly recommend physical therapy as the next step if these measures don't relieve your pain.  Avoid taking ibuprofen/motrin/advil/naproxen/Aleve/BC or Goody powder while you're taking the meloxicam.  The only thing you can take for pain, along with your prescriptions is acetaminophen (Tylenol) Cut back on alcohol--try to avoid drinking more than 3 drinks at a time.  It is better to have 1-2 drinks daily rather than having 12/weekend.   Muscle Cramps and Spasms Muscle cramps and spasms occur when a muscle or muscles tighten and you have no control over this tightening (involuntary muscle contraction). They are a common problem and can develop in any muscle. The most common place is in the calf muscles of the leg. Muscle cramps and muscle spasms are both involuntary muscle contractions, but there are some differences between the two:  Muscle cramps are painful. They come and go and may last for a few seconds or up to 15 minutes. Muscle cramps are often more forceful and last longer than muscle spasms.  Muscle spasms may or may not be painful. They may also last just a few seconds or much longer. Certain medical conditions, such as diabetes or Parkinson's disease, can make it more likely to develop cramps or spasms. However, cramps or spasms are usually not caused by a serious underlying problem. Common causes include:  Doing more physical work or exercise than your body is ready for (overexertion).  Overuse from repeating certain movements too many times.  Remaining in a certain  position for a long period of time.  Improper preparation, form, or technique while playing a sport or doing an activity.  Dehydration.  Injury.  Side effects of some medicines.  Abnormally low levels of the salts and minerals in your blood (electrolytes), especially potassium and calcium. This could happen if you are taking water pills (diuretics) or if you are pregnant. In many cases, the cause of muscle cramps or spasms is not known. Follow these instructions at home: Managing pain and stiffness      Try massaging, stretching, and relaxing the affected muscle. Do this for several minutes at a time.  If directed, apply heat to tight or tense muscles as often as told by your health care provider. Use the heat source that your health care provider recommends, such as a moist heat pack or a heating pad. ? Place a towel between your skin and the heat source. ? Leave the heat on for 20-30 minutes. ? Remove the heat if your skin turns bright red. This is especially important if you are unable to feel pain, heat, or cold. You may have a greater risk of getting burned.  If directed, put ice on the affected area. This may help if you are sore or have pain after a cramp or spasm. ? Put ice in a plastic bag. ? Place a towel between your skin and the bag. ? Leavethe ice on for 20 minutes, 2-3 times a day.  Try taking hot showers or baths to help relax tight muscles. Eating and drinking  Drink enough fluid to keep your  urine pale yellow. Staying well hydrated may help prevent cramps or spasms.  Eat a healthy diet that includes plenty of nutrients to help your muscles function. A healthy diet includes fruits and vegetables, lean protein, whole grains, and low-fat or nonfat dairy products. General instructions  If you are having frequent cramps, avoid intense exercise for several days.  Take over-the-counter and prescription medicines only as told by your health care provider.  Pay  attention to any changes in your symptoms.  Keep all follow-up visits as told by your health care provider. This is important. Contact a health care provider if:  Your cramps or spasms get more severe or happen more often.  Your cramps or spasms do not improve over time. Summary  Muscle cramps and spasms occur when a muscle or muscles tighten and you have no control over this tightening (involuntary muscle contraction).  The most common place for cramps or spasms to occur is in the calf muscles of the leg.  Massaging, stretching, and relaxing the affected muscle may relieve the cramp or spasm.  Drink enough fluid to keep your urine pale yellow. Staying well hydrated may help prevent cramps or spasms. This information is not intended to replace advice given to you by your health care provider. Make sure you discuss any questions you have with your health care provider. Document Released: 03/23/2002 Document Revised: 02/24/2018 Document Reviewed: 02/24/2018 Elsevier Patient Education  2020 [employer].

## 2019-05-04 NOTE — Progress Notes (Signed)
Chief Complaint  Patient presents with  . Back Pain    mid back pain since March. Did evisit through [facility] and they rx'd flexeril-all it did was make him tired. Had shots in his back, they helped for a while.     Woke up one morning the end of March with acute pain in the middle of his right back, "nauseating pain".  Feels better while stretching, but quickly resumes.  He had an e-visit, was prescribed Flexeril, which was "useless", made him too sleepy.  He has taken 400-600mg  of ibuprofen for severe pain (just once or twice a week). It would help temporarily. Tried one of his dad's soma a couple of weeks ago, and that helped without making him sleepy.  He continues to have pain in one focal spot, to the right of midline in his middle back.  Denies abdominal complaints, urinary complaints.  Ice/heat don't help at all (ice maybe a little).  Hasn't tried topical medications/patches.  Currently pain is 0/10 (seated).  Max has been 8/10 when he wakes up with pain.  He has had issues with lower back in the past, prescribed robaxin which didn't help, but then given some short of shot (he points to around the SI area, not the buttock, as area of injection), which helped for months.  This was 3 years ago.  PMH, PSH, SH reviewed. Alcohol: 2 beers during the week, 6 pack/night on the weekends.  Last couple of weeks hasn't been drinking during the week. Works from home, not busy. Noticed weight gain (was 212# mid-March), so cut back on the drinking during the week.  Current Outpatient Medications on File Prior to Visit  Medication Sig Dispense Refill  . montelukast (SINGULAIR) 10 MG tablet Take 1 tablet (10 mg total) by mouth daily. 90 tablet 3  . SYNTHROID 50 MCG tablet TAKE 1 TABLET(50 MCG) BY MOUTH DAILY BEFORE BREAKFAST 90 tablet 1  . albuterol (PROVENTIL HFA;VENTOLIN HFA) 108 (90 Base) MCG/ACT inhaler Inhale 1-2 puffs into the lungs as needed.    . cyclobenzaprine (FLEXERIL) 10 MG tablet Take 1  tablet (10 mg total) by mouth 3 (three) times daily as needed for muscle spasms. (Patient not taking: Reported on 05/04/2019) 30 tablet 0  . fluticasone (FLONASE) 50 MCG/ACT nasal spray Place 2 sprays into both nostrils daily.  5   No current facility-administered medications on file prior to visit.    Allergies  Allergen Reactions  . Doxycycline Itching    ROSACEA  . Levaquin  [Levofloxacin In D5w] Rash   ROS: no fever, chills, numbness, tingling, weakness, GI complaints or urinary complaints.  Back pain per HPI.  +weight gain, related to change in exercise and increased alcohol.   PHYSICAL EXAM:  BP 130/90   Pulse 80   Temp 98.2 F (36.8 C) (Temporal)   Ht 5\' 8"  (1.727 m)   Wt 227 lb 12.8 oz (103.3 kg)   BMI 34.64 kg/m   Well-appearing, obese, pleasant male with somewhat flat affect. He is in no distress. He is very talkative, slightly anxious. HEENT: conjunctiva and sclera are clear Neck: no c-spine tenderness Back: no spinal tenderness, SI tenderness or CVA tenderness. Area of tenderness is at lower rhomboid muscle on the right. He does have some discomfort with stretch (rotation of scapula). No palpable spasm Normal strength, gait  ASSESSMENT/PLAN:  Muscle spasm - Plan: carisoprodol (SOMA) 350 MG tablet  Rhomboid muscle pain - Plan: meloxicam (MOBIC) 15 MG tablet   Take meloxicam once  daily with food.  Take this until your pain has completely gone (may only need to be 7-10 days), or you can take the full 2 weeks, if needed. Use the Soma as needed for muscle spasm. Do not mix this with alcohol. I recommend heat followed by stretches (and massage, if possible) at least 3 times daily. Try topical medications/patches--ie Biofreeze or IcyHot, or SalonPas (with lidocaine).  I highly recommend physical therapy as the next step if these measures don't relieve your pain.  Avoid taking ibuprofen/motrin/advil/naproxen/Aleve/BC or Goody powder while you're taking the meloxicam.   The only thing you can take for pain, along with your prescriptions is acetaminophen (Tylenol) Cut back on alcohol--try to avoid drinking more than 3 drinks at a time.  It is better to have 1-2 drinks daily rather than having 12/weekend.

## 2019-05-22 ENCOUNTER — Encounter: Payer: BC Managed Care – PPO | Admitting: Family Medicine

## 2019-05-29 ENCOUNTER — Ambulatory Visit: Payer: BC Managed Care – PPO | Admitting: Family Medicine

## 2019-05-29 ENCOUNTER — Encounter: Payer: Self-pay | Admitting: Family Medicine

## 2019-05-29 ENCOUNTER — Other Ambulatory Visit: Payer: Self-pay

## 2019-05-29 VITALS — BP 128/80 | HR 83 | Temp 98.4°F | Ht 68.0 in | Wt 223.8 lb

## 2019-05-29 DIAGNOSIS — E079 Disorder of thyroid, unspecified: Secondary | ICD-10-CM

## 2019-05-29 DIAGNOSIS — Z7189 Other specified counseling: Secondary | ICD-10-CM | POA: Diagnosis not present

## 2019-05-29 DIAGNOSIS — K5900 Constipation, unspecified: Secondary | ICD-10-CM | POA: Diagnosis not present

## 2019-05-29 DIAGNOSIS — K649 Unspecified hemorrhoids: Secondary | ICD-10-CM

## 2019-05-29 DIAGNOSIS — Z0000 Encounter for general adult medical examination without abnormal findings: Secondary | ICD-10-CM

## 2019-05-29 DIAGNOSIS — J4520 Mild intermittent asthma, uncomplicated: Secondary | ICD-10-CM

## 2019-05-29 DIAGNOSIS — J3089 Other allergic rhinitis: Secondary | ICD-10-CM

## 2019-05-29 DIAGNOSIS — Z7185 Encounter for immunization safety counseling: Secondary | ICD-10-CM

## 2019-05-29 LAB — HEMOCCULT GUIAC POC 1CARD (OFFICE): Fecal Occult Blood, POC: NEGATIVE

## 2019-05-29 MED ORDER — HYDROCORTISONE ACETATE 25 MG RE SUPP
25.0000 mg | Freq: Two times a day (BID) | RECTAL | 0 refills | Status: DC
Start: 2019-05-29 — End: 2019-12-11

## 2019-05-29 MED ORDER — SYNTHROID 50 MCG PO TABS: ORAL_TABLET | ORAL | 1 refills | Status: DC

## 2019-05-29 NOTE — Patient Instructions (Addendum)
You can continue the fiber gummies and increasing physical activity to help with softer and more regular bowel movements.  If you need additional help, try taking 1 cap full of Miralax daily.   Use the steroid suppositories as needed for inflammation and pain.  You can also try doing warm sitz baths.   Follow up if not improving or if your symptoms are worsening.   We will call you with your results.   Let me know if you decide to get the HPV vaccine after you do research about it.  Look at the [facility] website       Constipation, Adult Constipation is when a person:  Poops (has a bowel movement) fewer times in a week than normal.  Has a hard time pooping.  Has poop that is dry, hard, or bigger than normal. Follow these instructions at home: Eating and drinking   Eat foods that have a lot of fiber, such as: ? Fresh fruits and vegetables. ? Whole grains. ? Beans.  Eat less of foods that are high in fat, low in fiber, or overly processed, such as: ? [place] fries. ? Hamburgers. ? Cookies. ? Candy. ? Soda.  Drink enough fluid to keep your pee (urine) clear or pale yellow. General instructions  Exercise regularly or as told by your doctor.  Go to the restroom when you feel like you need to poop. Do not hold it in.  Take over-the-counter and prescription medicines only as told by your doctor. These include any fiber supplements.  Do pelvic floor retraining exercises, such as: ? Doing deep breathing while relaxing your lower belly (abdomen). ? Relaxing your pelvic floor while pooping.  Watch your condition for any changes.  Keep all follow-up visits as told by your doctor. This is important. Contact a doctor if:  You have pain that gets worse.  You have a fever.  You have not pooped for 4 days.  You throw up (vomit).  You are not hungry.  You lose weight.  You are bleeding from the anus.  You have thin, pencil-like poop (stool). Get help right away if:   You have a fever, and your symptoms suddenly get worse.  You leak poop or have blood in your poop.  Your belly feels hard or bigger than normal (is bloated).  You have very bad belly pain.  You feel dizzy or you faint. This information is not intended to replace advice given to you by your health care provider. Make sure you discuss any questions you have with your health care provider. Document Released: 03/19/2008 Document Revised: 09/13/2017 Document Reviewed: 03/21/2016 Elsevier Patient Education  2020 [place] 32-69 Years Old, Male Preventive care refers to lifestyle choices and visits with your health care provider that can promote health and wellness. This includes:  A yearly physical exam. This is also called an annual well check.  Regular dental and eye exams.  Immunizations.  Screening for certain conditions.  Healthy lifestyle choices, such as eating a healthy diet, getting regular exercise, not using drugs or products that contain nicotine and tobacco, and limiting alcohol use. What can I expect for my preventive care visit? Physical exam Your health care provider will check:  Height and weight. These may be used to calculate body mass index (BMI), which is a measurement that tells if you are at a healthy weight.  Heart rate and blood pressure.  Your skin for abnormal spots. Counseling Your health care provider may  ask you questions about:  Alcohol, tobacco, and drug use.  Emotional well-being.  Home and relationship well-being.  Sexual activity.  Eating habits.  Work and work Statistician. What immunizations do I need?  Influenza (flu) vaccine  This is recommended every year. Tetanus, diphtheria, and pertussis (Tdap) vaccine  You may need a Td booster every 10 years. Varicella (chickenpox) vaccine  You may need this vaccine if you have not already been vaccinated. Human papillomavirus (HPV) vaccine  If recommended by  your health care provider, you may need three doses over 6 months. Measles, mumps, and rubella (MMR) vaccine  You may need at least one dose of MMR. You may also need a second dose. Meningococcal conjugate (MenACWY) vaccine  One dose is recommended if you are 72-23 years old and a Market researcher living in a residence hall, or if you have one of several medical conditions. You may also need additional booster doses. Pneumococcal conjugate (PCV13) vaccine  You may need this if you have certain conditions and were not previously vaccinated. Pneumococcal polysaccharide (PPSV23) vaccine  You may need one or two doses if you smoke cigarettes or if you have certain conditions. Hepatitis A vaccine  You may need this if you have certain conditions or if you travel or work in places where you may be exposed to hepatitis A. Hepatitis B vaccine  You may need this if you have certain conditions or if you travel or work in places where you may be exposed to hepatitis B. Haemophilus influenzae type b (Hib) vaccine  You may need this if you have certain risk factors. You may receive vaccines as individual doses or as more than one vaccine together in one shot (combination vaccines). Talk with your health care provider about the risks and benefits of combination vaccines. What tests do I need? Blood tests  Lipid and cholesterol levels. These may be checked every 5 years starting at age 38.  Hepatitis C test.  Hepatitis B test. Screening   Diabetes screening. This is done by checking your blood sugar (glucose) after you have not eaten for a while (fasting).  Sexually transmitted disease (STD) testing. Talk with your health care provider about your test results, treatment options, and if necessary, the need for more tests. Follow these instructions at home: Eating and drinking   Eat a diet that includes fresh fruits and vegetables, whole grains, lean protein, and low-fat dairy  products.  Take vitamin and mineral supplements as recommended by your health care provider.  Do not drink alcohol if your health care provider tells you not to drink.  If you drink alcohol: ? Limit how much you have to 0-2 drinks a day. ? Be aware of how much alcohol is in your drink. In the [place] one drink equals one 12 oz bottle of beer (355 mL), one 5 oz glass of wine (148 mL), or one 1 oz glass of hard liquor (44 mL). Lifestyle  Take daily care of your teeth and gums.  Stay active. Exercise for at least 30 minutes on 5 or more days each week.  Do not use any products that contain nicotine or tobacco, such as cigarettes, e-cigarettes, and chewing tobacco. If you need help quitting, ask your health care provider.  If you are sexually active, practice safe sex. Use a condom or other form of protection to prevent STIs (sexually transmitted infections). What's next?  Go to your health care provider once a year for a well check visit.  Ask your health care provider how often you should have your eyes and teeth checked.  Stay up to date on all vaccines. This information is not intended to replace advice given to you by your health care provider. Make sure you discuss any questions you have with your health care provider. Document Released: 11/27/2001 Document Revised: 09/25/2018 Document Reviewed: 09/25/2018 Elsevier Patient Education  2020 [employer].

## 2019-05-29 NOTE — Progress Notes (Signed)
Subjective:    Patient ID: Walter Spears, male    DOB: 12-19-1995, 23 y.o.   MRN: [id number]  HPI Chief Complaint  Patient presents with   Annual Exam   Constipation    X3-4 MONTHS    He is here for a complete physical exam and to follow up on chronic health conditions.. He has new concerns as well.  Last CPE: 2019  Since April, he has noticed harder stools and infrequent bowel movements. States he often feels like he has a Automotive engineer" on his anus. Reports feeling constipated and denies history of this prior to April. Normally has a bowel movement everything 3-4 days. Going 5-7 days without one now. Denies abdominal pain, blood in stool. No nausea, vomiting.   States his activity level has decreased significantly since April. No longer able to go to his gym. He has started being more active recently.  Started taking fiber gummies last week.   Exercise induced asthma- not needed albuterol since early March   Allergies-  Has not needed Flonase. Takes Singulair daily. Since age 58.   Lexapro- stopped this in February. States his mood is fine. No longer having depression or anxiety.   Taking Synthroid daily in the morning on an empty stomach.    Social history: Lives with girlfirend, works as a Psychologist, counselling. Transferred to [place], [place]  Denies smoking, drug use. Alcohol use on the weekend.  Diet: gained weight during pandemic but has cut out sugar  Exercise: no regular routine currently   Immunizations: Tdap UTD.   Health maintenance:  Colonoscopy: N/A Last PSA: N/A Last Dental Exam: summer 2019  Last Eye Exam: today, normal visual acuity  Wears seatbelt always, uses sunscreen, smoke detectors in home and functioning, does not text while driving, feels safe in home environment.  Reviewed allergies, medications, past medical, surgical, family, and social history.    Review of Systems Review of Systems Constitutional: -fever, -chills, -sweats, -unexpected  weight change,-fatigue ENT: -runny nose, -ear pain, -sore throat Cardiology:  -chest pain, -palpitations, -edema Respiratory: -cough, -shortness of breath, -wheezing Gastroenterology: -abdominal pain, -nausea, -vomiting, -diarrhea, +constipation  Hematology: -bleeding or bruising problems Musculoskeletal: -arthralgias, -myalgias, -joint swelling, -back pain Ophthalmology: -vision changes Urology: -dysuria, -difficulty urinating, -hematuria, -urinary frequency, -urgency Neurology: -headache, -weakness, -tingling, -numbness       Objective:   Physical Exam BP 128/80    Pulse 83    Temp 98.4 F (36.9 C) (Oral)    Ht 5\' 8"  (1.727 m)    Wt 223 lb 12.8 oz (101.5 kg)    SpO2 98%    BMI 34.03 kg/m   General Appearance:    Alert, cooperative, no distress, appears stated age  Head:    Normocephalic, without obvious abnormality, atraumatic  Eyes:    PERRL, conjunctiva/corneas clear, EOM's intact, fundi    benign  Ears:    Normal TM's and external ear canals  Nose:   Mask in place   Throat:  Mask in place   Neck:   Supple, no lymphadenopathy;  thyroid:  no   enlargement/tenderness/nodules; no carotid   bruit or JVD  Back:    Spine nontender, no curvature, ROM normal, no CVA     tenderness  Lungs:     Clear to auscultation bilaterally without wheezes, rales or     ronchi; respirations unlabored  Chest Wall:    No tenderness or deformity   Heart:    Regular rate and rhythm, S1 and S2 normal, no  murmur, rub   or gallop  Breast Exam:    No chest wall tenderness, masses or gynecomastia  Abdomen:     Soft, non-tender, nondistended, normoactive bowel sounds,    no masses, no hepatosplenomegaly  Genitalia:    Declines .  Rectal:     Normal sphincter tone, a small external hemorrhoid is present with mild erythema. no tenderness; guaiac negative stool.   Extremities:   No clubbing, cyanosis or edema  Pulses:   2+ and symmetric all extremities  Skin:   Skin color, texture, turgor normal, no rashes  or lesions  Lymph nodes:   Cervical, supraclavicular, and axillary nodes normal  Neurologic:   CNII-XII intact, normal strength, sensation and gait; reflexes 2+ and symmetric throughout          Psych:   Normal mood, affect, hygiene and grooming.          Assessment & Plan:  Routine general medical examination at a health care facility - Plan: CBC with Differential/Platelet, Comprehensive metabolic panel, TSH, T4, free, Lipid panel.  Seen today for a fasting CPE and other health conditions.  Discussed preventive healthcare, immunizations and safety.  Recommend he continue with self testicular exams.  Thyroid disease - Plan: TSH, T4, free, check thyroid function and adjust medication as appropriate.  Mild intermittent asthma without complication - Plan: Well controlled.  Has not had a flare in has not needed albuterol in several months.  Constipation, unspecified constipation type - Plan: POCT occult blood stool, most likely this is due to diet and exercise changes.  Discussed staying well-hydrated, eating adequate fiber in his diet and increasing his physical activity.  He has started taking fiber gummy's which seem to be improving symptoms so he will continue with these.  If he needs additional help, discussed taking 1 capful of MiraLAX daily.  He will follow-up if not improving or if symptoms worsen.  Hemorrhoids, unspecified hemorrhoid type - Plan: POCT occult blood stool, hydrocortisone (ANUSOL-HC) 25 MG suppository, small hemorrhoid noted on exam.  Hemoccult negative.  Anusol HC prescribed.  Also discussed sitz bath and keeping stools soft.  Environmental and seasonal allergies - Plan: No longer needing Flonase but he is taking Singulair daily.  Feels that they are controlled for now.  Immunization counseling - Plan: Counseling on HPV vaccine and recommended that he get this.  He declines today but will research it and let me know if he decides to get the series.

## 2019-05-30 LAB — COMPREHENSIVE METABOLIC PANEL
ALT: 17 IU/L (ref 0–44)
AST: 22 IU/L (ref 0–40)
Albumin/Globulin Ratio: 2.3 — ABNORMAL HIGH (ref 1.2–2.2)
Albumin: 5 g/dL (ref 4.1–5.2)
Alkaline Phosphatase: 65 IU/L (ref 39–117)
BUN/Creatinine Ratio: 8 — ABNORMAL LOW (ref 9–20)
BUN: 8 mg/dL (ref 6–20)
Bilirubin Total: 0.3 mg/dL (ref 0.0–1.2)
CO2: 23 mmol/L (ref 20–29)
Calcium: 9.5 mg/dL (ref 8.7–10.2)
Chloride: 99 mmol/L (ref 96–106)
Creatinine, Ser: 1.02 mg/dL (ref 0.76–1.27)
GFR calc Af Amer: 118 mL/min/{1.73_m2} (ref >59)
GFR calc non Af Amer: 102 mL/min/{1.73_m2} (ref >59)
Globulin, Total: 2.2 g/dL (ref 1.5–4.5)
Glucose: 88 mg/dL (ref 65–99)
Potassium: 4.2 mmol/L (ref 3.5–5.2)
Sodium: 139 mmol/L (ref 134–144)
Total Protein: 7.2 g/dL (ref 6.0–8.5)

## 2019-05-30 LAB — CBC WITH DIFFERENTIAL/PLATELET
Basophils Absolute: 0 10*3/uL (ref 0.0–0.2)
Basos: 1 %
EOS (ABSOLUTE): 0.1 10*3/uL (ref 0.0–0.4)
Eos: 1 %
Hematocrit: 49 % (ref 37.5–51.0)
Hemoglobin: 16.7 g/dL (ref 13.0–17.7)
Immature Grans (Abs): 0 10*3/uL (ref 0.0–0.1)
Immature Granulocytes: 0 %
Lymphocytes Absolute: 1.6 10*3/uL (ref 0.7–3.1)
Lymphs: 25 %
MCH: 29.2 pg (ref 26.6–33.0)
MCHC: 34.1 g/dL (ref 31.5–35.7)
MCV: 86 fL (ref 79–97)
Monocytes Absolute: 0.6 10*3/uL (ref 0.1–0.9)
Monocytes: 9 %
Neutrophils Absolute: 3.9 10*3/uL (ref 1.4–7.0)
Neutrophils: 64 %
Platelets: 364 10*3/uL (ref 150–450)
RBC: 5.71 x10E6/uL (ref 4.14–5.80)
RDW: 12.9 % (ref 11.6–15.4)
WBC: 6.2 10*3/uL (ref 3.4–10.8)

## 2019-05-30 LAB — T4, FREE: Free T4: 1.51 ng/dL (ref 0.82–1.77)

## 2019-05-30 LAB — LIPID PANEL
Chol/HDL Ratio: 5.2 ratio — ABNORMAL HIGH (ref 0.0–5.0)
Cholesterol, Total: 161 mg/dL (ref 100–199)
HDL: 31 mg/dL — ABNORMAL LOW (ref >39)
LDL Calculated: 74 mg/dL (ref 0–99)
Triglycerides: 279 mg/dL — ABNORMAL HIGH (ref 0–149)
VLDL Cholesterol Cal: 56 mg/dL — ABNORMAL HIGH (ref 5–40)

## 2019-05-30 LAB — TSH: TSH: 3.37 u[IU]/mL (ref 0.450–4.500)

## 2019-05-31 ENCOUNTER — Encounter: Payer: Self-pay | Admitting: Family Medicine

## 2019-05-31 DIAGNOSIS — E781 Pure hyperglyceridemia: Secondary | ICD-10-CM | POA: Insufficient documentation

## 2019-10-01 ENCOUNTER — Other Ambulatory Visit: Payer: Self-pay | Admitting: Family Medicine

## 2019-10-01 DIAGNOSIS — J4520 Mild intermittent asthma, uncomplicated: Secondary | ICD-10-CM

## 2019-10-01 DIAGNOSIS — J309 Allergic rhinitis, unspecified: Secondary | ICD-10-CM

## 2019-11-27 ENCOUNTER — Ambulatory Visit: Payer: BC Managed Care – PPO | Admitting: Family Medicine

## 2019-12-03 ENCOUNTER — Telehealth: Payer: Self-pay | Admitting: *Deleted

## 2019-12-03 DIAGNOSIS — E079 Disorder of thyroid, unspecified: Secondary | ICD-10-CM

## 2019-12-03 MED ORDER — SYNTHROID 50 MCG PO TABS: ORAL_TABLET | ORAL | 0 refills | Status: DC

## 2019-12-03 NOTE — Telephone Encounter (Signed)
Patient had to reschedule his appt for tomorrow until next Friday and asked for a small refill to hold him on his Synthroid until he gets back into town. I sent and rescheduled his appt.

## 2019-12-03 NOTE — Telephone Encounter (Signed)
Thanks

## 2019-12-04 ENCOUNTER — Ambulatory Visit: Payer: BC Managed Care – PPO | Admitting: Family Medicine

## 2019-12-11 ENCOUNTER — Encounter: Payer: Self-pay | Admitting: Family Medicine

## 2019-12-11 ENCOUNTER — Ambulatory Visit (INDEPENDENT_AMBULATORY_CARE_PROVIDER_SITE_OTHER): Payer: BC Managed Care – PPO | Admitting: Family Medicine

## 2019-12-11 ENCOUNTER — Other Ambulatory Visit: Payer: Self-pay

## 2019-12-11 VITALS — BP 120/82 | HR 82 | Wt 229.2 lb

## 2019-12-11 DIAGNOSIS — J3089 Other allergic rhinitis: Secondary | ICD-10-CM | POA: Diagnosis not present

## 2019-12-11 DIAGNOSIS — M7918 Myalgia, other site: Secondary | ICD-10-CM

## 2019-12-11 DIAGNOSIS — E079 Disorder of thyroid, unspecified: Secondary | ICD-10-CM | POA: Diagnosis not present

## 2019-12-11 DIAGNOSIS — E781 Pure hyperglyceridemia: Secondary | ICD-10-CM

## 2019-12-11 DIAGNOSIS — Z79899 Other long term (current) drug therapy: Secondary | ICD-10-CM | POA: Diagnosis not present

## 2019-12-11 DIAGNOSIS — F419 Anxiety disorder, unspecified: Secondary | ICD-10-CM

## 2019-12-11 DIAGNOSIS — J4520 Mild intermittent asthma, uncomplicated: Secondary | ICD-10-CM

## 2019-12-11 DIAGNOSIS — E669 Obesity, unspecified: Secondary | ICD-10-CM | POA: Insufficient documentation

## 2019-12-11 DIAGNOSIS — J309 Allergic rhinitis, unspecified: Secondary | ICD-10-CM

## 2019-12-11 MED ORDER — SYNTHROID 50 MCG PO TABS: ORAL_TABLET | ORAL | 1 refills | Status: DC

## 2019-12-11 MED ORDER — MONTELUKAST SODIUM 10 MG PO TABS: ORAL_TABLET | ORAL | 1 refills | Status: DC

## 2019-12-11 NOTE — Patient Instructions (Signed)
If you decide you would like to try physical therapy for your back, let me know.   You may want to try using a free app such as My Fitness Pal to track your calories.    We will be in touch with your lab results.

## 2019-12-11 NOTE — Progress Notes (Signed)
Subjective:    Patient ID: Walter Spears, male    DOB: 1996/03/29, 24 y.o.   MRN: [id number]  HPI Chief Complaint  Patient presents with  . follow-up    follow-up on thyroid and back pain on right side   Here today for a 6 month follow up.   States Walter Spears had stopped Lexapro and started back on it but on 1/2 tablet in November 2020 for anxiety. States it seems to help.   Thyroid disorder- taking Synthroid daily. Needs thyroid function checked.   States Walter Spears has gained weight. Eating healthier lately. Wants to lose 20 lbs.   Complains of chronic back pain. Walter Spears has been seen for this a couple of times over the past year. States pain comes and goes. Flared up again around Christmas. Sitting long periods makes this worse. Certain movements aggravate pain.  Walter Spears takes NSAIDs as needed. SOMA rarely.  Pain improved when Walter Spears was doing exercises including lifting weights to strengthen his back. Denies radiation. No numbness, tingling or weakness.   No fever, chills, chest pain, palpitations, shortness of breath, abdominal pain, N/V/D. No urinary symptoms.   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 This flares up hemorrhoids. They are ok today.   Asthma- triggers are cold weather and exercise. Manageable  Keeps allergies under control. Takes singulair.   Hypertriglyceridemia - states Walter Spears has been eating a healthier diet, healthy fats. Would like to recheck today.   Reviewed allergies, medications, past medical, surgical, family, and social history.   Review of Systems Pertinent positives and negatives in the history of present illness.     Objective:   Physical Exam Constitutional:      General: Walter Spears is not in acute distress.    Appearance: Normal appearance. Walter Spears is not ill-appearing.  Eyes:     Conjunctiva/sclera: Conjunctivae normal.  Cardiovascular:     Rate and Rhythm: Normal rate and regular rhythm.  Pulmonary:     Effort: Pulmonary effort is normal.     Breath sounds: Normal  breath sounds.  Musculoskeletal:     Cervical back: Normal, normal range of motion and neck supple.     Thoracic back: Tenderness present. No bony tenderness. Normal range of motion.       Back:     Comments: Right paraspinal muscle TTP, normal sensation and motion  Skin:    General: Skin is warm and dry.     Capillary Refill: Capillary refill takes less than 2 seconds.  Neurological:     General: No focal deficit present.     Mental Status: Walter Spears is alert and oriented to person, place, and time.     Cranial Nerves: No cranial nerve deficit.     Sensory: No sensory deficit.     Motor: No weakness.     Coordination: Coordination normal.  Psychiatric:        Mood and Affect: Mood normal.        Thought Content: Thought content normal.        Judgment: Judgment normal.    BP 120/82   Pulse 82   Wt 229 lb 3.2 oz (104 kg)   BMI 34.85 kg/m       Assessment & Plan:  Medication management - Plan: Comprehensive metabolic panel, TSH  Rhomboid muscle pain -this is a chronic issue. Improved with exercise last year. Discussed trying PT, Walter Spears declines for now. Discussed conservative treatment.   Thyroid disease - Plan: TSH, SYNTHROID 50 MCG tablet -follow  up pending labs   Environmental and seasonal allergies- taking Singulair, Flonase and doing fine  Anxiety- taking Lexapro again but only 1/2 tablet. Seems to be managed  Mild intermittent asthma without complication - Plan: montelukast (SINGULAIR) 10 MG tablet -controlled  Obesity (BMI 30-39.9) - Plan: Comprehensive metabolic panel, TSH -discussed healthy diet and exercise  Hypertriglyceridemia - Plan: Comprehensive metabolic panel, Lipid panel -eating a healthier, cleaner diet and would like to recheck lipids. Taking fish oil  Allergic rhinitis, unspecified seasonality, unspecified trigger - Plan: montelukast (SINGULAIR) 10 MG tablet

## 2019-12-12 ENCOUNTER — Other Ambulatory Visit: Payer: Self-pay | Admitting: Family Medicine

## 2019-12-12 LAB — COMPREHENSIVE METABOLIC PANEL
ALT: 26 IU/L (ref 0–44)
AST: 26 IU/L (ref 0–40)
Albumin/Globulin Ratio: 1.8 (ref 1.2–2.2)
Albumin: 4.8 g/dL (ref 4.1–5.2)
Alkaline Phosphatase: 71 IU/L (ref 39–117)
BUN/Creatinine Ratio: 9 (ref 9–20)
BUN: 9 mg/dL (ref 6–20)
Bilirubin Total: 0.4 mg/dL (ref 0.0–1.2)
CO2: 23 mmol/L (ref 20–29)
Calcium: 9.7 mg/dL (ref 8.7–10.2)
Chloride: 100 mmol/L (ref 96–106)
Creatinine, Ser: 0.96 mg/dL (ref 0.76–1.27)
GFR calc Af Amer: 126 mL/min/{1.73_m2} (ref >59)
GFR calc non Af Amer: 109 mL/min/{1.73_m2} (ref >59)
Globulin, Total: 2.6 g/dL (ref 1.5–4.5)
Glucose: 90 mg/dL (ref 65–99)
Potassium: 4.4 mmol/L (ref 3.5–5.2)
Sodium: 139 mmol/L (ref 134–144)
Total Protein: 7.4 g/dL (ref 6.0–8.5)

## 2019-12-12 LAB — LIPID PANEL
Chol/HDL Ratio: 5.3 ratio — ABNORMAL HIGH (ref 0.0–5.0)
Cholesterol, Total: 171 mg/dL (ref 100–199)
HDL: 32 mg/dL — ABNORMAL LOW (ref >39)
LDL Chol Calc (NIH): 96 mg/dL (ref 0–99)
Triglycerides: 255 mg/dL — ABNORMAL HIGH (ref 0–149)
VLDL Cholesterol Cal: 43 mg/dL — ABNORMAL HIGH (ref 5–40)

## 2019-12-12 LAB — TSH: TSH: 1.73 u[IU]/mL (ref 0.450–4.500)

## 2020-03-17 ENCOUNTER — Other Ambulatory Visit: Payer: Self-pay | Admitting: Family Medicine

## 2020-03-17 DIAGNOSIS — E079 Disorder of thyroid, unspecified: Secondary | ICD-10-CM

## 2020-03-17 MED ORDER — ESCITALOPRAM OXALATE 10 MG PO TABS: 5.0000 mg | ORAL_TABLET | Freq: Every day | ORAL | 0 refills | Status: DC

## 2020-03-17 MED ORDER — SYNTHROID 50 MCG PO TABS: ORAL_TABLET | ORAL | 1 refills | Status: DC

## 2020-03-17 NOTE — Telephone Encounter (Signed)
See meds 

## 2020-03-17 NOTE — Telephone Encounter (Signed)
Okay to refill his Lexapro.  Also okay to refill Synthroid.  We need to check the box to not allow substitutions.  See me before sending these.  Thank you

## 2020-03-17 NOTE — Telephone Encounter (Signed)
Pt called and states that he needs a refill on the generic lexapro  Pt also states that he needs something from the provider stating that he has to take name brand SYNTHROID, states he new insurance will not cover, unless it is justified,   he states he took the generic before and it caused chest pain, and he has been paying out of pocked for the synthroid, pt would like his rx sent to the   [facility] on [address], [place],  [address] Pt can be reached at [phone]

## 2020-04-27 ENCOUNTER — Encounter: Payer: Self-pay | Admitting: Family Medicine

## 2020-04-27 ENCOUNTER — Ambulatory Visit: Payer: BC Managed Care – PPO | Admitting: Family Medicine

## 2020-04-27 ENCOUNTER — Other Ambulatory Visit: Payer: Self-pay

## 2020-04-27 VITALS — BP 122/80 | HR 78 | Temp 97.8°F | Wt 234.0 lb

## 2020-04-27 DIAGNOSIS — M5442 Lumbago with sciatica, left side: Secondary | ICD-10-CM

## 2020-04-27 MED ORDER — KETOROLAC TROMETHAMINE 60 MG/2ML IM SOLN
60.0000 mg | Freq: Once | INTRAMUSCULAR | Status: AC
  Administered 2020-04-27: 60 mg via INTRAMUSCULAR

## 2020-04-27 MED ORDER — MELOXICAM 15 MG PO TABS: 15.0000 mg | ORAL_TABLET | Freq: Every day | ORAL | 0 refills | Status: DC

## 2020-04-27 MED ORDER — CYCLOBENZAPRINE HCL 10 MG PO TABS: 10.0000 mg | ORAL_TABLET | Freq: Three times a day (TID) | ORAL | 0 refills | Status: DC | PRN

## 2020-04-27 NOTE — Progress Notes (Signed)
   Subjective:    Patient ID: Walter Spears, male    DOB: September 22, 1996, 24 y.o.   MRN: [id number]  HPI Chief Complaint  Patient presents with  . back pain    lower back pain for a week.    Complains of a 1 week history of low back pain which is worse on the left side and radiates into his left buttock.  No known injury.  States he had a similar episode approximately 5 years ago. States certain movements make his pain worse.  He has to get up very slowly due to pain.  Laying down improves his pain.  Denies numbness, tingling or weakness in his extremities. Denies fever, chills, unexplained weight loss, abdominal pain, nausea, vomiting, diarrhea or urinary symptoms.  States he has a history of upper back pain and he had relief with naproxen or meloxicam and Soma and is no longer having upper back pain.  States he has taken naproxen and Soma for his low back without any relief.    Reviewed allergies, medications, past medical, surgical, family, and social history.   Review of Systems Pertinent positives and negatives in the history of present illness.     Objective:   Physical Exam BP 122/80   Pulse 78   Temp 97.8 F (36.6 C)   Wt 234 lb (106.1 kg)   BMI 35.58 kg/m  Alert and in no distress. Neck is supple with normal range of motion. Cardiac exam shows a regular sinus rhythm without murmurs or gallops. Lungs are clear to auscultation.  He has good sensation and motion of his back. Non tender.  No pain with flexion or lateral bending.  He does have some discomfort with extension.  Negative straight leg raise.  DTRs symmetric and normal.       Assessment & Plan:  Acute left-sided low back pain with left-sided sciatica - Plan: meloxicam (MOBIC) 15 MG tablet, cyclobenzaprine (FLEXERIL) 10 MG tablet, ketorolac (TORADOL) injection 60 mg  No red flag symptoms.  Lower extremities neurologically intact.  Recommend conservative treatment.  Toradol 60 mg IM injection given in office.  He  may start meloxicam tomorrow and Flexeril as needed.  Discussed sedating side effects of Flexeril.  Recommend heat or ice, topical analgesic and stretches.  I demonstrated some stretches for his low back in the office.  If he is not improving or if he is getting worse, he will return and we will decide whether we need to do some imaging at that point and potentially refer for PT.

## 2020-04-27 NOTE — Patient Instructions (Signed)
Take meloxicam with food.  Be aware that this medication can increase your risk of GI bleeding.  You may want to take an over-the-counter Pepcid with it to help protect your stomach.  You may take the Flexeril, muscle relaxant, for severe pain but be aware that it can be sedating.   Use heat or ice.  You may also use Salonpas with lidocaine or Biofreeze or any other topical pain medicine.  Follow-up if you are not improving.  We can consider physical therapy if needed.

## 2020-05-19 DIAGNOSIS — K011 Impacted teeth: Secondary | ICD-10-CM | POA: Diagnosis not present

## 2020-05-29 NOTE — Patient Instructions (Signed)
Preventive Care 19-26 Years Old, Male Preventive care refers to lifestyle choices and visits with your health care provider that can promote health and wellness. This includes:  A yearly physical exam. This is also called an annual well check.  Regular dental and eye exams.  Immunizations.  Screening for certain conditions.  Healthy lifestyle choices, such as eating a healthy diet, getting regular exercise, not using drugs or products that contain nicotine and tobacco, and limiting alcohol use. What can I expect for my preventive care visit? Physical exam Your health care provider will check:  Height and weight. These may be used to calculate body mass index (BMI), which is a measurement that tells if you are at a healthy weight.  Heart rate and blood pressure.  Your skin for abnormal spots. Counseling Your health care provider may ask you questions about:  Alcohol, tobacco, and drug use.  Emotional well-being.  Home and relationship well-being.  Sexual activity.  Eating habits.  Work and work Statistician. What immunizations do I need?  Influenza (flu) vaccine  This is recommended every year. Tetanus, diphtheria, and pertussis (Tdap) vaccine  You may need a Td booster every 10 years. Varicella (chickenpox) vaccine  You may need this vaccine if you have not already been vaccinated. Human papillomavirus (HPV) vaccine  If recommended by your health care provider, you may need three doses over 6 months. Measles, mumps, and rubella (MMR) vaccine  You may need at least one dose of MMR. You may also need a second dose. Meningococcal conjugate (MenACWY) vaccine  One dose is recommended if you are 45-76 years old and a Market researcher living in a residence hall, or if you have one of several medical conditions. You may also need additional booster doses. Pneumococcal conjugate (PCV13) vaccine  You may need this if you have certain conditions and were not  previously vaccinated. Pneumococcal polysaccharide (PPSV23) vaccine  You may need one or two doses if you smoke cigarettes or if you have certain conditions. Hepatitis A vaccine  You may need this if you have certain conditions or if you travel or work in places where you may be exposed to hepatitis A. Hepatitis B vaccine  You may need this if you have certain conditions or if you travel or work in places where you may be exposed to hepatitis B. Haemophilus influenzae type b (Hib) vaccine  You may need this if you have certain risk factors. You may receive vaccines as individual doses or as more than one vaccine together in one shot (combination vaccines). Talk with your health care provider about the risks and benefits of combination vaccines. What tests do I need? Blood tests  Lipid and cholesterol levels. These may be checked every 5 years starting at age 17.  Hepatitis C test.  Hepatitis B test. Screening   Diabetes screening. This is done by checking your blood sugar (glucose) after you have not eaten for a while (fasting).  Sexually transmitted disease (STD) testing. Talk with your health care provider about your test results, treatment options, and if necessary, the need for more tests. Follow these instructions at home: Eating and drinking   Eat a diet that includes fresh fruits and vegetables, whole grains, lean protein, and low-fat dairy products.  Take vitamin and mineral supplements as recommended by your health care provider.  Do not drink alcohol if your health care provider tells you not to drink.  If you drink alcohol: ? Limit how much you have to 0-2  drinks a day. ? Be aware of how much alcohol is in your drink. In the [place] one drink equals one 12 oz bottle of beer (355 mL), one 5 oz glass of wine (148 mL), or one 1 oz glass of hard liquor (44 mL). Lifestyle  Take daily care of your teeth and gums.  Stay active. Exercise for at least 30 minutes on 5 or  more days each week.  Do not use any products that contain nicotine or tobacco, such as cigarettes, e-cigarettes, and chewing tobacco. If you need help quitting, ask your health care provider.  If you are sexually active, practice safe sex. Use a condom or other form of protection to prevent STIs (sexually transmitted infections). What's next?  Go to your health care provider once a year for a well check visit.  Ask your health care provider how often you should have your eyes and teeth checked.  Stay up to date on all vaccines. This information is not intended to replace advice given to you by your health care provider. Make sure you discuss any questions you have with your health care provider. Document Revised: 09/25/2018 Document Reviewed: 09/25/2018 Elsevier Patient Education  2020 [employer].

## 2020-05-29 NOTE — Progress Notes (Signed)
Subjective:    Patient ID: Walter Spears, male    DOB: 09/22/1996, 24 y.o.   MRN: [id number]  HPI Chief Complaint  Patient presents with  . fasting cpe    fasting cpe, had wisdom teeth out and was only alot of ibupfroen anad pain med. bp has been elevated bacause of this   He is here for a complete physical exam. Last CPE: 2020  Other providers: ENT  Oral surgeon- Dr. Brayton Mars on August 5th he had all 4 wisdom teeth removed. Recovering well.   Asthma- no flares. Using Flonase and singulair.   Hypothyroid- taking Synthroid 50 mcg daily   Mood is good with 1/2 tablet of Lexapro daily.   Complains of 2 small bumps on the top of his penis that he noticed approximately 1-2 months ago. States they are not painful. He is concerned he may have HPV. States he has not been vaccinated for HPV. He is in a monogamous relationship.    Social history: Lives with girlfriend , works as a Psychologist, counselling.  Denies smoking, drinking alcohol, drug use Diet: reports a fairly healthy diet Exercise: no regular exercise lately   Immunizations: . J & J Covid vaccine, Tdap UTD  Health maintenance:  Colonoscopy: N/A Last PSA: N/A Last Dental Exam: in May  Last Eye Exam: years ago   States he has been tested for STDs in the recent past and does not need this today.   Wears seatbelt always,  smoke detectors in home and functioning, does not text while driving, feels safe in home environment.  Reviewed allergies, medications, past medical, surgical, family, and social history.   Review of Systems Review of Systems Constitutional: -fever, -chills, -sweats, -unexpected weight change,-fatigue ENT: -runny nose, -ear pain, -sore throat Cardiology:  -chest pain, -palpitations, -edema Respiratory: -cough, -shortness of breath, -wheezing Gastroenterology: -abdominal pain, -nausea, -vomiting, -diarrhea, -constipation  Hematology: -bleeding or bruising problems Musculoskeletal:  -arthralgias, -myalgias, -joint swelling, -back pain Ophthalmology: -vision changes Urology: -dysuria, -difficulty urinating, -hematuria, -urinary frequency, -urgency Neurology: -headache, -weakness, -tingling, -numbness       Objective:   Physical Exam BP 140/90   Pulse 76   Ht 5' 7.5" (1.715 m)   Wt 232 lb (105.2 kg)   BMI 35.80 kg/m   General Appearance:    Alert, cooperative, no distress, appears stated age  Head:    Normocephalic, without obvious abnormality, atraumatic  Eyes:    PERRL, conjunctiva/corneas clear, EOM's intact  Ears:    Normal TM's and external ear canals  Nose:   Mask in place   Throat:   Mask in place   Neck:   Supple, no lymphadenopathy;  thyroid:  no   enlargement/tenderness/nodules; no  JVD  Back:    Spine nontender, no curvature, ROM normal, no CVA     tenderness  Lungs:     Clear to auscultation bilaterally without wheezes, rales or     ronchi; respirations unlabored  Chest Wall:    No tenderness or deformity   Heart:    Regular rate and rhythm, S1 and S2 normal, no murmur, rub   or gallop  Breast Exam:    No chest wall tenderness, masses or gynecomastia  Abdomen:     Soft, non-tender, nondistended, normoactive bowel sounds,    no masses, no hepatosplenomegaly  Genitalia:    External genitalia exam shows 2 very small circular, raised lesions side by side on foreskin, no erythema, no vesicles, non tender.  Testicles  without masses.  No inguinal hernias.  Rectal:   Deferred due to age <40 and lack of symptoms  Extremities:   No clubbing, cyanosis or edema  Pulses:   2+ and symmetric all extremities  Skin:   Skin color, texture, turgor normal, no rashes or lesions  Lymph nodes:   Cervical, supraclavicular, and axillary nodes normal  Neurologic:   CNII-XII intact, normal strength, sensation and gait; reflexes 2+ and symmetric throughout          Psych:   Normal mood, affect, hygiene and grooming.         Assessment & Plan:  Routine general medical  examination at a health care facility - Plan: CBC with Differential/Platelet, Comprehensive metabolic panel -Preventive health care reviewed. Recommend regular dental, eye and testicular exams. Immunizations reviewed. He may return for an HPV vaccine at his convenience. Discussed that this is a 3 shot series. Counseling on healthy lifestyle including diet and exercise. Discussed safety and health promotion  Mild intermittent asthma without complication -Controlled. Continue allergy medication as needed  Thyroid disease - Plan: TSH, T4, free -Continue taking Synthroid and follow-up pending labs.  Hypertriglyceridemia - Plan: Lipid panel -History of elevated triglycerides which may be familial. Counseling on diet and exercise. Follow-up pending results  Penile lesion -Discussed that the area may be warts but difficult to tell at this time since they are so small. I also had Dorothea Ogle, [place], examine patient and he agrees. Discussed the options of cryotherapy or referral to dermatology. He would like to hold off for now.  Obesity (BMI 30-39.9) -Remain healthy diet and exercise for weight loss  Anxiety -Seems to be well controlled on Lexapro  Immunization counseling

## 2020-05-30 ENCOUNTER — Ambulatory Visit: Payer: BC Managed Care – PPO | Admitting: Family Medicine

## 2020-05-30 ENCOUNTER — Encounter: Payer: Self-pay | Admitting: Family Medicine

## 2020-05-30 ENCOUNTER — Other Ambulatory Visit: Payer: Self-pay

## 2020-05-30 VITALS — BP 140/90 | HR 76 | Ht 67.5 in | Wt 232.0 lb

## 2020-05-30 DIAGNOSIS — E669 Obesity, unspecified: Secondary | ICD-10-CM

## 2020-05-30 DIAGNOSIS — E079 Disorder of thyroid, unspecified: Secondary | ICD-10-CM | POA: Diagnosis not present

## 2020-05-30 DIAGNOSIS — F419 Anxiety disorder, unspecified: Secondary | ICD-10-CM

## 2020-05-30 DIAGNOSIS — J4520 Mild intermittent asthma, uncomplicated: Secondary | ICD-10-CM | POA: Diagnosis not present

## 2020-05-30 DIAGNOSIS — Z7185 Encounter for immunization safety counseling: Secondary | ICD-10-CM

## 2020-05-30 DIAGNOSIS — Z7189 Other specified counseling: Secondary | ICD-10-CM

## 2020-05-30 DIAGNOSIS — E781 Pure hyperglyceridemia: Secondary | ICD-10-CM

## 2020-05-30 DIAGNOSIS — N489 Disorder of penis, unspecified: Secondary | ICD-10-CM | POA: Diagnosis not present

## 2020-05-30 DIAGNOSIS — Z0000 Encounter for general adult medical examination without abnormal findings: Secondary | ICD-10-CM

## 2020-05-31 LAB — CBC WITH DIFFERENTIAL/PLATELET
Basophils Absolute: 0 10*3/uL (ref 0.0–0.2)
Basos: 1 %
EOS (ABSOLUTE): 0.1 10*3/uL (ref 0.0–0.4)
Eos: 1 %
Hematocrit: 44.9 % (ref 37.5–51.0)
Hemoglobin: 15.5 g/dL (ref 13.0–17.7)
Immature Grans (Abs): 0 10*3/uL (ref 0.0–0.1)
Immature Granulocytes: 1 %
Lymphocytes Absolute: 1.6 10*3/uL (ref 0.7–3.1)
Lymphs: 28 %
MCH: 29.7 pg (ref 26.6–33.0)
MCHC: 34.5 g/dL (ref 31.5–35.7)
MCV: 86 fL (ref 79–97)
Monocytes Absolute: 0.5 10*3/uL (ref 0.1–0.9)
Monocytes: 8 %
Neutrophils Absolute: 3.6 10*3/uL (ref 1.4–7.0)
Neutrophils: 61 %
Platelets: 378 10*3/uL (ref 150–450)
RBC: 5.22 x10E6/uL (ref 4.14–5.80)
RDW: 11.8 % (ref 11.6–15.4)
WBC: 5.9 10*3/uL (ref 3.4–10.8)

## 2020-05-31 LAB — LIPID PANEL
Chol/HDL Ratio: 5.3 ratio — ABNORMAL HIGH (ref 0.0–5.0)
Cholesterol, Total: 168 mg/dL (ref 100–199)
HDL: 32 mg/dL — ABNORMAL LOW (ref >39)
LDL Chol Calc (NIH): 109 mg/dL — ABNORMAL HIGH (ref 0–99)
Triglycerides: 150 mg/dL — ABNORMAL HIGH (ref 0–149)
VLDL Cholesterol Cal: 27 mg/dL (ref 5–40)

## 2020-05-31 LAB — COMPREHENSIVE METABOLIC PANEL
ALT: 19 IU/L (ref 0–44)
AST: 19 IU/L (ref 0–40)
Albumin/Globulin Ratio: 2 (ref 1.2–2.2)
Albumin: 4.5 g/dL (ref 4.1–5.2)
Alkaline Phosphatase: 71 IU/L (ref 48–121)
BUN/Creatinine Ratio: 12 (ref 9–20)
BUN: 11 mg/dL (ref 6–20)
Bilirubin Total: 0.4 mg/dL (ref 0.0–1.2)
CO2: 24 mmol/L (ref 20–29)
Calcium: 9.6 mg/dL (ref 8.7–10.2)
Chloride: 100 mmol/L (ref 96–106)
Creatinine, Ser: 0.91 mg/dL (ref 0.76–1.27)
GFR calc Af Amer: 134 mL/min/{1.73_m2} (ref >59)
GFR calc non Af Amer: 116 mL/min/{1.73_m2} (ref >59)
Globulin, Total: 2.3 g/dL (ref 1.5–4.5)
Glucose: 105 mg/dL — ABNORMAL HIGH (ref 65–99)
Potassium: 4.8 mmol/L (ref 3.5–5.2)
Sodium: 139 mmol/L (ref 134–144)
Total Protein: 6.8 g/dL (ref 6.0–8.5)

## 2020-05-31 LAB — TSH: TSH: 1.74 u[IU]/mL (ref 0.450–4.500)

## 2020-05-31 LAB — T4, FREE: Free T4: 1.54 ng/dL (ref 0.82–1.77)

## 2020-09-30 ENCOUNTER — Other Ambulatory Visit: Payer: Self-pay | Admitting: Family Medicine

## 2020-09-30 DIAGNOSIS — J309 Allergic rhinitis, unspecified: Secondary | ICD-10-CM

## 2020-09-30 DIAGNOSIS — J4520 Mild intermittent asthma, uncomplicated: Secondary | ICD-10-CM

## 2020-09-30 NOTE — Telephone Encounter (Signed)
Ok to refill lexapro 

## 2020-09-30 NOTE — Telephone Encounter (Signed)
Ok to give refill and then have him come in for a visit in 3 months.

## 2020-11-28 ENCOUNTER — Encounter: Payer: BC Managed Care – PPO | Admitting: Family Medicine

## 2020-12-02 ENCOUNTER — Ambulatory Visit (INDEPENDENT_AMBULATORY_CARE_PROVIDER_SITE_OTHER): Payer: BC Managed Care – PPO | Admitting: Family Medicine

## 2020-12-02 ENCOUNTER — Other Ambulatory Visit: Payer: Self-pay

## 2020-12-02 ENCOUNTER — Encounter: Payer: Self-pay | Admitting: Family Medicine

## 2020-12-02 VITALS — BP 118/70 | HR 71 | Wt 226.8 lb

## 2020-12-02 DIAGNOSIS — E785 Hyperlipidemia, unspecified: Secondary | ICD-10-CM

## 2020-12-02 DIAGNOSIS — J4520 Mild intermittent asthma, uncomplicated: Secondary | ICD-10-CM

## 2020-12-02 DIAGNOSIS — F419 Anxiety disorder, unspecified: Secondary | ICD-10-CM | POA: Diagnosis not present

## 2020-12-02 DIAGNOSIS — E079 Disorder of thyroid, unspecified: Secondary | ICD-10-CM

## 2020-12-02 DIAGNOSIS — E669 Obesity, unspecified: Secondary | ICD-10-CM | POA: Diagnosis not present

## 2020-12-02 HISTORY — DX: Hyperlipidemia, unspecified: E78.5

## 2020-12-02 NOTE — Progress Notes (Signed)
   Subjective:    Patient ID: Walter Spears, male    DOB: 09/11/1996, 25 y.o.   MRN: [id number]  HPI Chief Complaint  Patient presents with  . med check    Med check.    He is here for a 6 month med check.   Thyroid disorder-states he was taking Synthroid and it was changed to levothyroxine.  He would like to make sure his thyroid function is stable since the medication change.  Obesity -states he does not eat sweets.  He has been eating "healthy fats" including fish and avocado.  He is hoping this has not helped his triglycerides  Has been checking his blood sugar.   Anxiety -states he is still doing well on 5mg  of Lexapro.  Asthma - no recent issues. Uses albuterol rarely.   No other concerns today.  Denies fever, chills, dizziness, chest pain, palpitations, shortness of breath, abdominal pain, N/V/D.  Reviewed allergies, medications, past medical, surgical, family, and social history.    Review of Systems Pertinent positives and negatives in the history of present illness.     Objective:   Physical Exam BP 118/70   Pulse 71   Wt 226 lb 12.8 oz (102.9 kg)   BMI 35.00 kg/m   Alert and oriented in no acute distress.  Respirations unlabored.  Normal speech, mood and thought process.      Assessment & Plan:  Thyroid disease - Plan: TSH -Synthroid was changed to levothyroxine.  Check thyroid function and follow-up  Anxiety -Doing well on 5 mg of Lexapro.  Obesity (BMI 30-39.9) - Plan: Lipid panel -Continue with healthy diet and increase physical activity as tolerated  Dyslipidemia with elevated low density lipoprotein (LDL) cholesterol and abnormally low high density lipoprotein (HDL) cholesterol - Plan: Lipid panel -Recent dietary changes including more healthy fats.  Check lipid panel and follow-up  Mild intermittent asthma without complication -Well-controlled.

## 2020-12-03 LAB — LIPID PANEL
Chol/HDL Ratio: 5.4 ratio — ABNORMAL HIGH (ref 0.0–5.0)
Cholesterol, Total: 184 mg/dL (ref 100–199)
HDL: 34 mg/dL — ABNORMAL LOW (ref >39)
LDL Chol Calc (NIH): 120 mg/dL — ABNORMAL HIGH (ref 0–99)
Triglycerides: 167 mg/dL — ABNORMAL HIGH (ref 0–149)
VLDL Cholesterol Cal: 30 mg/dL (ref 5–40)

## 2020-12-03 LAB — TSH: TSH: 2.11 u[IU]/mL (ref 0.450–4.500)

## 2020-12-11 NOTE — Progress Notes (Signed)
It appears that he has not seen his results or my recommendations. Please call him.

## 2020-12-26 ENCOUNTER — Other Ambulatory Visit: Payer: Self-pay | Admitting: Family Medicine

## 2020-12-26 DIAGNOSIS — J309 Allergic rhinitis, unspecified: Secondary | ICD-10-CM

## 2020-12-26 DIAGNOSIS — J4520 Mild intermittent asthma, uncomplicated: Secondary | ICD-10-CM

## 2021-03-26 ENCOUNTER — Other Ambulatory Visit: Payer: Self-pay | Admitting: Family Medicine

## 2021-03-26 DIAGNOSIS — J4520 Mild intermittent asthma, uncomplicated: Secondary | ICD-10-CM

## 2021-03-26 DIAGNOSIS — J309 Allergic rhinitis, unspecified: Secondary | ICD-10-CM

## 2021-04-12 ENCOUNTER — Encounter: Payer: Self-pay | Admitting: Internal Medicine

## 2021-04-17 ENCOUNTER — Other Ambulatory Visit: Payer: Self-pay | Admitting: Family Medicine

## 2021-06-09 ENCOUNTER — Encounter: Payer: BC Managed Care – PPO | Admitting: Family Medicine

## 2021-06-13 NOTE — Progress Notes (Signed)
Subjective:    Patient ID: Walter Spears, male    DOB: 07/31/1996, 25 y.o.   MRN: [id number]  HPI Chief Complaint  Patient presents with   Annual Exam    CPE no other issues, dentist Dr. Eddie Dibbles in [place].    He is here for a complete physical exam.  Other providers: Dr. Gerald Stabs - [place]   Asthma- not an issue.  Rarely uses inhaler. Singulair for allergies   States he has not needed Flonase in approximately 1 year. No sinus issues.   Taking Lexapro 5 mg. Anxiety and depression are controlled.   Family hx of BEST eye disease. Does not want to know if he has the gene.    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  Denies smoking or drug use. Drinks beer on the weekends. Diet: fairly healthy  Exercise:  3 x per week. Running and walking    Health maintenance:   Last Dental Exam: UTD  Last Eye Exam: years ago    Wears seatbelt always, uses sunscreen, smoke detectors in home and functioning, does not text while driving, feels safe in home environment.  Reviewed allergies, medications, past medical, surgical, family, and social history.    Review of Systems Review of Systems Constitutional: -fever, -chills, -sweats, -unexpected weight change,-fatigue ENT: -runny nose, -ear pain, -sore throat Cardiology:  -chest pain, -palpitations, -edema Respiratory: -cough, -shortness of breath, -wheezing Gastroenterology: -abdominal pain, -nausea, -vomiting, -diarrhea, -constipation  Hematology: -bleeding or bruising problems Musculoskeletal: -arthralgias, -myalgias, -joint swelling, -back pain Ophthalmology: -vision changes Urology: -dysuria, -difficulty urinating, -hematuria, -urinary frequency, -urgency Neurology: -headache, -weakness, -tingling, -numbness       Objective:   Physical Exam BP 120/78   Pulse 64   Temp 98.6 F (37 C)   Ht 5' 7.5" (1.715 m)   Wt 232 lb 6.4 oz (105.4 kg)   BMI 35.86 kg/m   General Appearance:    Alert,  cooperative, no distress, appears stated age  Head:    Normocephalic, without obvious abnormality, atraumatic  Eyes:    PERRL, conjunctiva/corneas clear, EOM's intact  Ears:    Normal TM's and external ear canals  Nose:   Mask on   Throat:   Mask on   Neck:   Supple, no lymphadenopathy;  thyroid:  no   enlargement/tenderness/nodules  Back:    Spine nontender, no curvature, ROM normal, no CVA     tenderness  Lungs:     Clear to auscultation bilaterally without wheezes, rales or     ronchi; respirations unlabored  Chest Wall:    No tenderness or deformity   Heart:    Regular rate and rhythm, S1 and S2 normal, no murmur, rub   or gallop  Breast Exam:    No chest wall tenderness, masses or gynecomastia  Abdomen:     Soft, non-tender, nondistended, normoactive bowel sounds,    no masses, no hepatosplenomegaly  Genitalia:    Deferred   Rectal:   Deferred due to age <40 and lack of symptoms  Extremities:   No clubbing, cyanosis or edema  Pulses:   2+ and symmetric all extremities  Skin:   Skin color, texture, turgor normal, no rashes or lesions  Lymph nodes:   Cervical, supraclavicular, and axillary nodes normal  Neurologic:   CNII-XII intact, normal strength, sensation and gait          Psych:   Normal mood, affect, hygiene and grooming.        Assessment &  Plan:  Routine general medical examination at a health care facility - Plan: CBC with Differential/Platelet, Comprehensive metabolic panel, TSH, Lipid panel -He is here today for fasting CPE.  Preventive health care reviewed.  Discussed self testicular exams and the importance of this in his age group.  Counseling on healthy lifestyle including diet and exercise.  He will return for a flu shot.  Congratulated him on upcoming wedding.  Discussed safety and health promotion.  Dyslipidemia with elevated low density lipoprotein (LDL) cholesterol and abnormally low high density lipoprotein (HDL) cholesterol - Plan: Lipid panel -Continue eating  healthy fats and avoiding transfat.  Continue getting adequate physical activity.  Follow-up pending lipid panel results  Anxiety -Well-controlled on 5 mg of Lexapro.  Thyroid disease - Plan: TSH -Continue levothyroxine.  Follow-up pending TSH result  Mild intermittent asthma without complication -Well-controlled.  Continue Singulair for allergies to prevent flares

## 2021-06-13 NOTE — Patient Instructions (Signed)
Preventive Care 39-27 Years Old, Male Preventive care refers to lifestyle choices and visits with your health care provider that can promote health and wellness. This includes: A yearly physical exam. This is also called an annual wellness visit. Regular dental and eye exams. Immunizations. Screening for certain conditions. Healthy lifestyle choices, such as: Eating a healthy diet. Getting regular exercise. Not using drugs or products that contain nicotine and tobacco. Limiting alcohol use. What can I expect for my preventive care visit? Physical exam Your health care provider may check your: Height and weight. These may be used to calculate your BMI (body mass index). BMI is a measurement that tells if you are at a healthy weight. Heart rate and blood pressure. Body temperature. Skin for abnormal spots. Counseling Your health care provider may ask you questions about your: Past medical problems. Family's medical history. Alcohol, tobacco, and drug use. Emotional well-being. Home life and relationship well-being. Sexual activity. Diet, exercise, and sleep habits. Work and work Statistician. Access to firearms. What immunizations do I need?  Vaccines are usually given at various ages, according to a schedule. Your health care provider will recommend vaccines for you based on your age, medicalhistory, and lifestyle or other factors, such as travel or where you work. What tests do I need? Blood tests Lipid and cholesterol levels. These may be checked every 5 years starting at age 76. Hepatitis C test. Hepatitis B test. Screening  Diabetes screening. This is done by checking your blood sugar (glucose) after you have not eaten for a while (fasting). Genital exam to check for testicular cancer or hernias. STD (sexually transmitted disease) testing, if you are at risk. Talk with your health care provider about your test results, treatment options,and if necessary, the need for more  tests. Follow these instructions at home: Eating and drinking  Eat a healthy diet that includes fresh fruits and vegetables, whole grains, lean protein, and low-fat dairy products. Drink enough fluid to keep your urine pale yellow. Take vitamin and mineral supplements as recommended by your health care provider. Do not drink alcohol if your health care provider tells you not to drink. If you drink alcohol: Limit how much you have to 0-2 drinks a day. Be aware of how much alcohol is in your drink. In the [place] one drink equals one 12 oz bottle of beer (355 mL), one 5 oz glass of wine (148 mL), or one 1 oz glass of hard liquor (44 mL).  Lifestyle Take daily care of your teeth and gums. Brush your teeth every morning and night with fluoride toothpaste. Floss one time each day. Stay active. Exercise for at least 30 minutes 5 or more days each week. Do not use any products that contain nicotine or tobacco, such as cigarettes, e-cigarettes, and chewing tobacco. If you need help quitting, ask your health care provider. Do not use drugs. If you are sexually active, practice safe sex. Use a condom or other form of protection to prevent STIs (sexually transmitted infections). Find healthy ways to cope with stress, such as: Meditation, yoga, or listening to music. Journaling. Talking to a trusted person. Spending time with friends and family. Safety Always wear your seat belt while driving or riding in a vehicle. Do not drive: If you have been drinking alcohol. Do not ride with someone who has been drinking. When you are tired or distracted. While texting. Wear a helmet and other protective equipment during sports activities. If you have firearms in your house, make sure  you follow all gun safety procedures. Seek help if you have been physically or sexually abused. What's next? Go to your health care provider once a year for an annual wellness visit. Ask your health care provider how often  you should have your eyes and teeth checked. Stay up to date on all vaccines. This information is not intended to replace advice given to you by your health care provider. Make sure you discuss any questions you have with your healthcare provider. Document Revised: 06/17/2019 Document Reviewed: 09/25/2018 Elsevier Patient Education  2022 [employer].

## 2021-06-14 ENCOUNTER — Encounter: Payer: BC Managed Care – PPO | Admitting: Family Medicine

## 2021-06-14 ENCOUNTER — Other Ambulatory Visit: Payer: Self-pay

## 2021-06-14 ENCOUNTER — Encounter: Payer: Self-pay | Admitting: Family Medicine

## 2021-06-14 ENCOUNTER — Ambulatory Visit (INDEPENDENT_AMBULATORY_CARE_PROVIDER_SITE_OTHER): Payer: BC Managed Care – PPO | Admitting: Family Medicine

## 2021-06-14 VITALS — BP 120/78 | HR 64 | Temp 98.6°F | Ht 67.5 in | Wt 232.4 lb

## 2021-06-14 DIAGNOSIS — E079 Disorder of thyroid, unspecified: Secondary | ICD-10-CM

## 2021-06-14 DIAGNOSIS — F419 Anxiety disorder, unspecified: Secondary | ICD-10-CM

## 2021-06-14 DIAGNOSIS — J4520 Mild intermittent asthma, uncomplicated: Secondary | ICD-10-CM

## 2021-06-14 DIAGNOSIS — Z0000 Encounter for general adult medical examination without abnormal findings: Secondary | ICD-10-CM

## 2021-06-14 DIAGNOSIS — E785 Hyperlipidemia, unspecified: Secondary | ICD-10-CM | POA: Diagnosis not present

## 2021-06-15 LAB — CBC WITH DIFFERENTIAL/PLATELET
Basophils Absolute: 0 10*3/uL (ref 0.0–0.2)
Basos: 1 %
EOS (ABSOLUTE): 0.1 10*3/uL (ref 0.0–0.4)
Eos: 2 %
Hematocrit: 48.2 % (ref 37.5–51.0)
Hemoglobin: 16.2 g/dL (ref 13.0–17.7)
Immature Grans (Abs): 0 10*3/uL (ref 0.0–0.1)
Immature Granulocytes: 0 %
Lymphocytes Absolute: 1.1 10*3/uL (ref 0.7–3.1)
Lymphs: 18 %
MCH: 28.9 pg (ref 26.6–33.0)
MCHC: 33.6 g/dL (ref 31.5–35.7)
MCV: 86 fL (ref 79–97)
Monocytes Absolute: 0.6 10*3/uL (ref 0.1–0.9)
Monocytes: 9 %
Neutrophils Absolute: 4.3 10*3/uL (ref 1.4–7.0)
Neutrophils: 70 %
Platelets: 317 10*3/uL (ref 150–450)
RBC: 5.61 x10E6/uL (ref 4.14–5.80)
RDW: 12.5 % (ref 11.6–15.4)
WBC: 6.2 10*3/uL (ref 3.4–10.8)

## 2021-06-15 LAB — COMPREHENSIVE METABOLIC PANEL
ALT: 25 IU/L (ref 0–44)
AST: 20 IU/L (ref 0–40)
Albumin/Globulin Ratio: 2.4 — ABNORMAL HIGH (ref 1.2–2.2)
Albumin: 5 g/dL (ref 4.1–5.2)
Alkaline Phosphatase: 60 IU/L (ref 44–121)
BUN/Creatinine Ratio: 9 (ref 9–20)
BUN: 9 mg/dL (ref 6–20)
Bilirubin Total: 0.5 mg/dL (ref 0.0–1.2)
CO2: 21 mmol/L (ref 20–29)
Calcium: 9.5 mg/dL (ref 8.7–10.2)
Chloride: 100 mmol/L (ref 96–106)
Creatinine, Ser: 0.97 mg/dL (ref 0.76–1.27)
Globulin, Total: 2.1 g/dL (ref 1.5–4.5)
Glucose: 98 mg/dL (ref 65–99)
Potassium: 4.6 mmol/L (ref 3.5–5.2)
Sodium: 139 mmol/L (ref 134–144)
Total Protein: 7.1 g/dL (ref 6.0–8.5)
eGFR: 110 mL/min/{1.73_m2} (ref >59)

## 2021-06-15 LAB — LIPID PANEL
Chol/HDL Ratio: 5.8 ratio — ABNORMAL HIGH (ref 0.0–5.0)
Cholesterol, Total: 202 mg/dL — ABNORMAL HIGH (ref 100–199)
HDL: 35 mg/dL — ABNORMAL LOW (ref >39)
LDL Chol Calc (NIH): 128 mg/dL — ABNORMAL HIGH (ref 0–99)
Triglycerides: 219 mg/dL — ABNORMAL HIGH (ref 0–149)
VLDL Cholesterol Cal: 39 mg/dL (ref 5–40)

## 2021-06-15 LAB — TSH: TSH: 1.68 u[IU]/mL (ref 0.450–4.500)

## 2021-09-25 ENCOUNTER — Other Ambulatory Visit: Payer: Self-pay

## 2021-09-25 DIAGNOSIS — J309 Allergic rhinitis, unspecified: Secondary | ICD-10-CM

## 2021-09-25 DIAGNOSIS — J4520 Mild intermittent asthma, uncomplicated: Secondary | ICD-10-CM

## 2021-09-25 MED ORDER — MONTELUKAST SODIUM 10 MG PO TABS: ORAL_TABLET | ORAL | 0 refills | Status: DC

## 2021-09-25 MED ORDER — LEVOTHYROXINE SODIUM 50 MCG PO TABS: ORAL_TABLET | ORAL | 0 refills | Status: DC

## 2021-10-12 ENCOUNTER — Telehealth: Payer: Self-pay | Admitting: Physician Assistant

## 2021-10-12 MED ORDER — ESCITALOPRAM OXALATE 10 MG PO TABS: 5.0000 mg | ORAL_TABLET | Freq: Every day | ORAL | 1 refills | Status: DC

## 2021-10-12 NOTE — Telephone Encounter (Signed)
[facility] sent refill request for lexapro please send to the [facility], [address]

## 2021-12-17 NOTE — Progress Notes (Signed)
Acute Office Visit  Subjective:    Patient ID: Walter Spears, male    DOB: 27-Sep-1996, 26 y.o.   MRN: [id number]  Chief Complaint  Patient presents with   Medication Refill    Medication refill and labs. Pt is fasting. Pt stated that his dad was recently diagnosed with Hypertriglyceridemia     HPI  Patient is in today for a follow up appointment. Reports a family history of hyperlipidemia, diabetes, and hypertension and requests to have his cholesterol and thyroid labs checked today. Requests a refill of meloxicam and soma for chronic low back pain; states flexeril causes 1 - 2 days of grogginess and he doesn't tolerate it well.  At home states blood pressure has been about 117/80.  Past Medical History:  Diagnosis Date   Asthma    Depression    Dyslipidemia with elevated low density lipoprotein (LDL) cholesterol and abnormally low high density lipoprotein (HDL) cholesterol 12/02/2020   Environmental and seasonal allergies    History of sinusitis 11/21/2017   Hypertriglyceridemia    Left varicocele 11/21/2017   Thyroid disease     Past Surgical History:  Procedure Laterality Date   ADENOIDECTOMY     TONSILLECTOMY     WISDOM TOOTH EXTRACTION      Family History  Problem Relation Age of Onset   Brain cancer Mother        unsure if cancer    Hypertension Father    Alcohol abuse Father    Hyperlipidemia Father    Hypertension Paternal Grandmother    Heart disease Paternal Grandfather    Diabetes Paternal Grandfather    COPD Paternal Grandfather     Social History   Socioeconomic History   Marital status: Married    Spouse name: Not on file   Number of children: Not on file   Years of education: Not on file   Highest education level: Not on file  Occupational History   Not on file  Tobacco Use   Smoking status: Never   Smokeless tobacco: Never  Vaping Use   Vaping Use: Never used  Substance and Sexual Activity   Alcohol use: Yes    Comment: beer on the  weekends (6pack on Fri and Sat night)   Drug use: Yes    Types: Marijuana    Comment: not often (not in the last year)   Sexual activity: Yes  Other Topics Concern   Not on file  Social History Narrative   Not on file   Social Determinants of Health   Financial Resource Strain: Not on file  Food Insecurity: Not on file  Transportation Needs: Not on file  Physical Activity: Not on file  Stress: Not on file  Social Connections: Not on file  Intimate Partner Violence: Not on file    Outpatient Medications Prior to Visit  Medication Sig Dispense Refill   albuterol (PROVENTIL HFA;VENTOLIN HFA) 108 (90 Base) MCG/ACT inhaler Inhale 1-2 puffs into the lungs as needed.     fluticasone (FLONASE) 50 MCG/ACT nasal spray Place 2 sprays into both nostrils daily.  5   escitalopram (LEXAPRO) 10 MG tablet Take 0.5 tablets (5 mg total) by mouth daily. 90 tablet 1   levothyroxine (SYNTHROID) 50 MCG tablet TAKE 1 TABLET(50 MCG) BY MOUTH DAILY BEFORE AND BREAKFAST 90 tablet 0   meloxicam (MOBIC) 15 MG tablet Take 1 tablet (15 mg total) by mouth daily. 30 tablet 0   montelukast (SINGULAIR) 10 MG tablet TAKE 1 TABLET(10 MG)  BY MOUTH DAILY 90 tablet 0   No facility-administered medications prior to visit.    Allergies  Allergen Reactions   Doxycycline Itching    ROSACEA   Levaquin  [Levofloxacin In D5w] Rash   Aloe Rash    Review of Systems  Constitutional:  Negative for activity change, chills, fatigue and fever.  HENT:  Negative for congestion, ear pain, hearing loss and voice change.   Eyes:  Negative for pain and redness.  Respiratory:  Negative for cough and shortness of breath.   Cardiovascular:  Negative for leg swelling.  Gastrointestinal:  Negative for constipation, diarrhea, nausea and vomiting.  Endocrine: Negative for polyuria.  Genitourinary:  Negative for flank pain and frequency.  Musculoskeletal:  Positive for back pain and myalgias. Negative for joint swelling and neck  pain.  Skin:  Negative for rash.  Neurological:  Negative for dizziness.  Hematological:  Does not bruise/bleed easily.  Psychiatric/Behavioral:  Negative for agitation and behavioral problems.       Objective:    Physical Exam Vitals and nursing note reviewed.  Constitutional:      General: He is not in acute distress.    Appearance: Normal appearance.  HENT:     Head: Normocephalic and atraumatic.     Right Ear: External ear normal.     Left Ear: External ear normal.     Nose: No congestion.  Eyes:     Extraocular Movements: Extraocular movements intact.     Conjunctiva/sclera: Conjunctivae normal.     Pupils: Pupils are equal, round, and reactive to light.  Cardiovascular:     Rate and Rhythm: Normal rate and regular rhythm.     Pulses: Normal pulses.     Heart sounds: Normal heart sounds.  Pulmonary:     Effort: Pulmonary effort is normal.     Breath sounds: Normal breath sounds. No wheezing.  Abdominal:     General: Bowel sounds are normal.     Palpations: Abdomen is soft.  Musculoskeletal:        General: Normal range of motion.     Cervical back: Normal range of motion and neck supple.     Right lower leg: No edema.     Left lower leg: No edema.  Skin:    General: Skin is warm and dry.     Findings: No rash.  Neurological:     Mental Status: He is alert and oriented to person, place, and time.     Gait: Gait normal.  Psychiatric:        Mood and Affect: Mood normal.        Behavior: Behavior normal.    BP 128/78    Pulse 71    Wt 241 lb 3.2 oz (109.4 kg)    SpO2 97%    BMI 37.22 kg/m   BP Readings from Last 5 Encounters:  12/18/21 128/78  06/14/21 120/78  12/02/20 118/70  05/30/20 140/90  04/27/20 122/80    Wt Readings from Last 3 Encounters:  12/18/21 241 lb 3.2 oz (109.4 kg)  06/14/21 232 lb 6.4 oz (105.4 kg)  12/02/20 226 lb 12.8 oz (102.9 kg)    There are no preventive care reminders to display for this patient.   There are no preventive  care reminders to display for this patient.   Lab Results  Component Value Date   TSH 1.680 06/14/2021   Lab Results  Component Value Date   WBC 6.2 06/14/2021   HGB 16.2 06/14/2021  HCT 48.2 06/14/2021   MCV 86 06/14/2021   PLT 317 06/14/2021   Lab Results  Component Value Date   NA 139 06/14/2021   K 4.6 06/14/2021   CO2 21 06/14/2021   GLUCOSE 98 06/14/2021   BUN 9 06/14/2021   CREATININE 0.97 06/14/2021   BILITOT 0.5 06/14/2021   ALKPHOS 60 06/14/2021   AST 20 06/14/2021   ALT 25 06/14/2021   PROT 7.1 06/14/2021   ALBUMIN 5.0 06/14/2021   CALCIUM 9.5 06/14/2021   EGFR 110 06/14/2021   Lab Results  Component Value Date   CHOL 202 (H) 06/14/2021   Lab Results  Component Value Date   HDL 35 (L) 06/14/2021   Lab Results  Component Value Date   LDLCALC 128 (H) 06/14/2021   Lab Results  Component Value Date   TRIG 219 (H) 06/14/2021   Lab Results  Component Value Date   CHOLHDL 5.8 (H) 06/14/2021   No results found for: HGBA1C     Assessment & Plan:   Problem List Items Addressed This Visit       Respiratory   Mild intermittent asthma without complication   Relevant Medications   montelukast (SINGULAIR) 10 MG tablet   Allergic rhinitis   Relevant Medications   montelukast (SINGULAIR) 10 MG tablet     Endocrine   Acquired hypothyroidism   Relevant Medications   levothyroxine (SYNTHROID) 50 MCG tablet   Other Relevant Orders   TSH + free T4     Nervous and Auditory   Chronic bilateral low back pain with bilateral sciatica   Relevant Medications   escitalopram (LEXAPRO) 10 MG tablet   meloxicam (MOBIC) 15 MG tablet     Other   Mixed hyperlipidemia - Primary   Relevant Orders   Lipid panel   Anxiety with depression   Relevant Medications   escitalopram (LEXAPRO) 10 MG tablet     Meds ordered this encounter  Medications   escitalopram (LEXAPRO) 10 MG tablet    Sig: Take 0.5 tablets (5 mg total) by mouth daily.    Dispense:  90  tablet    Refill:  3    Order Specific Question:   Supervising Provider    Answer:   Denita Lung [[id number]]   levothyroxine (SYNTHROID) 50 MCG tablet    Sig: TAKE 1 TABLET(50 MCG) BY MOUTH DAILY BEFORE AND BREAKFAST    Dispense:  90 tablet    Refill:  3    Order Specific Question:   Supervising Provider    Answer:   Denita Lung [[id number]]   montelukast (SINGULAIR) 10 MG tablet    Sig: TAKE 1 TABLET(10 MG) BY MOUTH DAILY    Dispense:  90 tablet    Refill:  3    Order Specific Question:   Supervising Provider    Answer:   Denita Lung [[id number]]   meloxicam (MOBIC) 15 MG tablet    Sig: Take 1 tablet (15 mg total) by mouth daily.    Dispense:  30 tablet    Refill:  0    Order Specific Question:   Supervising Provider    Answer:   Denita Lung [phone]   Patient advised that will not refill soma because it is supposed to be a short term medication and has a high potential for dependence, patient declined to try skelaxin instead.   Irene Pap, PA-C

## 2021-12-18 ENCOUNTER — Other Ambulatory Visit: Payer: Self-pay

## 2021-12-18 ENCOUNTER — Ambulatory Visit: Payer: BC Managed Care – PPO | Admitting: Physician Assistant

## 2021-12-18 ENCOUNTER — Encounter: Payer: Self-pay | Admitting: Physician Assistant

## 2021-12-18 VITALS — BP 128/78 | HR 71 | Ht 67.5 in | Wt 241.2 lb

## 2021-12-18 DIAGNOSIS — M5442 Lumbago with sciatica, left side: Secondary | ICD-10-CM

## 2021-12-18 DIAGNOSIS — J4520 Mild intermittent asthma, uncomplicated: Secondary | ICD-10-CM

## 2021-12-18 DIAGNOSIS — E039 Hypothyroidism, unspecified: Secondary | ICD-10-CM | POA: Diagnosis not present

## 2021-12-18 DIAGNOSIS — Z6837 Body mass index (BMI) 37.0-37.9, adult: Secondary | ICD-10-CM | POA: Insufficient documentation

## 2021-12-18 DIAGNOSIS — M5441 Lumbago with sciatica, right side: Secondary | ICD-10-CM

## 2021-12-18 DIAGNOSIS — Z83438 Family history of other disorder of lipoprotein metabolism and other lipidemia: Secondary | ICD-10-CM | POA: Insufficient documentation

## 2021-12-18 DIAGNOSIS — E782 Mixed hyperlipidemia: Secondary | ICD-10-CM

## 2021-12-18 DIAGNOSIS — J309 Allergic rhinitis, unspecified: Secondary | ICD-10-CM

## 2021-12-18 DIAGNOSIS — G8929 Other chronic pain: Secondary | ICD-10-CM

## 2021-12-18 DIAGNOSIS — F418 Other specified anxiety disorders: Secondary | ICD-10-CM

## 2021-12-18 DIAGNOSIS — Z8249 Family history of ischemic heart disease and other diseases of the circulatory system: Secondary | ICD-10-CM | POA: Insufficient documentation

## 2021-12-18 DIAGNOSIS — Z833 Family history of diabetes mellitus: Secondary | ICD-10-CM | POA: Insufficient documentation

## 2021-12-18 MED ORDER — LEVOTHYROXINE SODIUM 50 MCG PO TABS: ORAL_TABLET | ORAL | 3 refills | Status: DC

## 2021-12-18 MED ORDER — MONTELUKAST SODIUM 10 MG PO TABS: ORAL_TABLET | ORAL | 3 refills | Status: DC

## 2021-12-18 MED ORDER — MELOXICAM 15 MG PO TABS: 15.0000 mg | ORAL_TABLET | Freq: Every day | ORAL | 0 refills | Status: DC

## 2021-12-18 MED ORDER — ESCITALOPRAM OXALATE 10 MG PO TABS: 5.0000 mg | ORAL_TABLET | Freq: Every day | ORAL | 3 refills | Status: DC

## 2021-12-18 NOTE — Patient Instructions (Addendum)
For high cholesterol - Increase fiber intake (Benefiber or Metamucil, Cherrios,  oatmeal, beans, nuts, fruits and vegetables), limit saturated fats (in fried foods, red meat), can add OTC fish oil supplement, eat fish with Omega-3 fatty acids like salmon and tuna, exercise for 30 minutes 3 - 5 times a week, drink 8 - 10 glasses of water a day. ? ?

## 2021-12-19 LAB — LIPID PANEL
Chol/HDL Ratio: 5.1 ratio — ABNORMAL HIGH (ref 0.0–5.0)
Cholesterol, Total: 210 mg/dL — ABNORMAL HIGH (ref 100–199)
HDL: 41 mg/dL (ref >39)
LDL Chol Calc (NIH): 133 mg/dL — ABNORMAL HIGH (ref 0–99)
Triglycerides: 200 mg/dL — ABNORMAL HIGH (ref 0–149)
VLDL Cholesterol Cal: 36 mg/dL (ref 5–40)

## 2021-12-19 LAB — TSH+FREE T4
Free T4: 1.45 ng/dL (ref 0.82–1.77)
TSH: 2.22 u[IU]/mL (ref 0.450–4.500)

## 2022-01-02 ENCOUNTER — Telehealth: Payer: Self-pay | Admitting: Physician Assistant

## 2022-01-02 NOTE — Telephone Encounter (Signed)
Pt called and is requesting a refil on his albuterol please send to the [facility], [place] [address] ?

## 2022-01-04 ENCOUNTER — Other Ambulatory Visit: Payer: Self-pay | Admitting: Physician Assistant

## 2022-01-04 DIAGNOSIS — J4520 Mild intermittent asthma, uncomplicated: Secondary | ICD-10-CM

## 2022-01-04 MED ORDER — ALBUTEROL SULFATE HFA 108 (90 BASE) MCG/ACT IN AERS: 1.0000 | INHALATION_SPRAY | Freq: Four times a day (QID) | RESPIRATORY_TRACT | 1 refills | Status: DC | PRN

## 2022-04-13 ENCOUNTER — Encounter: Payer: Self-pay | Admitting: Internal Medicine

## 2022-06-15 ENCOUNTER — Encounter: Payer: BC Managed Care – PPO | Admitting: Physician Assistant

## 2022-06-20 ENCOUNTER — Encounter: Payer: Self-pay | Admitting: Internal Medicine

## 2022-08-29 ENCOUNTER — Encounter: Payer: Self-pay | Admitting: Medical

## 2022-08-29 ENCOUNTER — Ambulatory Visit (INDEPENDENT_AMBULATORY_CARE_PROVIDER_SITE_OTHER): Payer: BC Managed Care – PPO | Admitting: Medical

## 2022-08-29 VITALS — BP 114/64 | HR 98 | Ht 67.0 in | Wt 237.4 lb

## 2022-08-29 DIAGNOSIS — J309 Allergic rhinitis, unspecified: Secondary | ICD-10-CM | POA: Diagnosis not present

## 2022-08-29 DIAGNOSIS — E039 Hypothyroidism, unspecified: Secondary | ICD-10-CM

## 2022-08-29 DIAGNOSIS — J4520 Mild intermittent asthma, uncomplicated: Secondary | ICD-10-CM

## 2022-08-29 DIAGNOSIS — Z83438 Family history of other disorder of lipoprotein metabolism and other lipidemia: Secondary | ICD-10-CM

## 2022-08-29 DIAGNOSIS — Z8249 Family history of ischemic heart disease and other diseases of the circulatory system: Secondary | ICD-10-CM

## 2022-08-29 DIAGNOSIS — F418 Other specified anxiety disorders: Secondary | ICD-10-CM

## 2022-08-29 DIAGNOSIS — Z0000 Encounter for general adult medical examination without abnormal findings: Secondary | ICD-10-CM

## 2022-08-29 DIAGNOSIS — Z833 Family history of diabetes mellitus: Secondary | ICD-10-CM

## 2022-08-29 DIAGNOSIS — Z8709 Personal history of other diseases of the respiratory system: Secondary | ICD-10-CM

## 2022-08-29 DIAGNOSIS — Z131 Encounter for screening for diabetes mellitus: Secondary | ICD-10-CM

## 2022-08-29 DIAGNOSIS — J3089 Other allergic rhinitis: Secondary | ICD-10-CM

## 2022-08-29 DIAGNOSIS — E782 Mixed hyperlipidemia: Secondary | ICD-10-CM

## 2022-08-29 DIAGNOSIS — E669 Obesity, unspecified: Secondary | ICD-10-CM

## 2022-08-29 NOTE — Progress Notes (Signed)
Subjective:   HPI  Walter Spears is a 26 y.o. male who presents for Chief Complaint  Patient presents with   fasting cpe,     Fasting cpe, no concerns, noticed some floaters in eye but no issues seeing    Patient Care Team: Elhadji Pecore, Leward Quan as PCP - General (Family Medicine) Sees dentist Sees eye doctor   Concerns: He has some questions about lipids, prior labs  He notes having all childhood vaccines except HPV, but doesn't want this.  Married, monogamous  Declines flu shot  Reviewed their medical, surgical, family, social, medication, and allergy history and updated chart as appropriate.  Past Medical History:  Diagnosis Date   Asthma    Depression    Dyslipidemia with elevated low density lipoprotein (LDL) cholesterol and abnormally low high density lipoprotein (HDL) cholesterol 12/02/2020   Environmental and seasonal allergies    History of sinusitis 11/21/2017   Hypertriglyceridemia    Left varicocele 11/21/2017   Thyroid disease     Past Surgical History:  Procedure Laterality Date   ADENOIDECTOMY     TONSILLECTOMY     WISDOM TOOTH EXTRACTION      Family History  Problem Relation Age of Onset   Brain cancer Mother        unsure if cancer    Hypertension Father    Hyperlipidemia Father    Hypertension Paternal Grandmother    Heart disease Paternal Grandfather    Diabetes Paternal Grandfather    COPD Paternal Grandfather      Current Outpatient Medications:    albuterol (VENTOLIN HFA) 108 (90 Base) MCG/ACT inhaler, Inhale 1-2 puffs into the lungs every 6 (six) hours as needed., Disp: 8 g, Rfl: 1   escitalopram (LEXAPRO) 10 MG tablet, Take 0.5 tablets (5 mg total) by mouth daily., Disp: 90 tablet, Rfl: 3   levothyroxine (SYNTHROID) 50 MCG tablet, TAKE 1 TABLET(50 MCG) BY MOUTH DAILY BEFORE AND BREAKFAST, Disp: 90 tablet, Rfl: 3   meloxicam (MOBIC) 15 MG tablet, Take 1 tablet (15 mg total) by mouth daily., Disp: 30 tablet, Rfl: 0   montelukast  (SINGULAIR) 10 MG tablet, TAKE 1 TABLET(10 MG) BY MOUTH DAILY, Disp: 90 tablet, Rfl: 3  Allergies  Allergen Reactions   Doxycycline Itching    ROSACEA   Levaquin  [Levofloxacin In D5w] Rash   Aloe Rash     Review of Systems Constitutional: -fever, -chills, -sweats, -unexpected weight change, -decreased appetite, -fatigue Allergy: -sneezing, -itching, -congestion Dermatology: -changing moles, --rash, -lumps ENT: -runny nose, -ear pain, -sore throat, -hoarseness, -sinus pain, -teeth pain, - ringing in ears, -hearing loss, -nosebleeds Cardiology: -chest pain, -palpitations, -swelling, -difficulty breathing when lying flat, -waking up short of breath Respiratory: -cough, -shortness of breath, -difficulty breathing with exercise or exertion, -wheezing, -coughing up blood Gastroenterology: -abdominal pain, -nausea, -vomiting, -diarrhea, -constipation, -blood in stool, -changes in bowel movement, -difficulty swallowing or eating Hematology: -bleeding, -bruising  Musculoskeletal: -joint aches, -muscle aches, -joint swelling, -back pain, -neck pain, -cramping, -changes in gait Ophthalmology: denies vision changes, eye redness, itching, discharge Urology: -burning with urination, -difficulty urinating, -blood in urine, -urinary frequency, -urgency, -incontinence Neurology: -headache, -weakness, -tingling, -numbness, -memory loss, -falls, -dizziness Psychology: -depressed mood, -agitation, -sleep problems Male GU: no testicular mass, pain, no lymph nodes swollen, no swelling, no rash.      08/29/2022    3:09 PM 12/18/2021    8:58 AM 06/14/2021    8:38 AM 12/02/2020   11:34 AM 05/30/2020    9:41  AM  Depression screen PHQ 2/9  Decreased Interest 0 0 0 0 0  Down, Depressed, Hopeless 0 0 0 0 0  PHQ - 2 Score 0 0 0 0 0  Altered sleeping 0      Tired, decreased energy 0      Change in appetite 0      Feeling bad or failure about yourself  0      Trouble concentrating 0      Moving slowly or  fidgety/restless 0      Suicidal thoughts 0      PHQ-9 Score 0      Difficult doing work/chores Not difficult at all           Objective:  BP 114/64   Pulse 98   Ht '5\' 7"'$  (1.702 m)   Wt 237 lb 6.4 oz (107.7 kg)   BMI 37.18 kg/m   General appearance: alert, no distress, WD/WN, Caucasian male Skin: scattered macules, no worrisome lesions HEENT: normocephalic, conjunctiva/corneas normal, sclerae anicteric, PERRLA, EOMi, nares patent, no discharge or erythema, pharynx normal Oral cavity: MMM, tongue normal, teeth in good repair Neck: supple, no lymphadenopathy, no thyromegaly, no masses, normal ROM, no bruits Chest: non tender, normal shape and expansion Heart: RRR, normal S1, S2, no murmurs Lungs: CTA bilaterally, no wheezes, rhonchi, or rales Abdomen: +bs, soft, non tender, non distended, no masses, no hepatomegaly, no splenomegaly, no bruits Back: non tender, normal ROM, no scoliosis Musculoskeletal: upper extremities non tender, no obvious deformity, normal ROM throughout, lower extremities non tender, no obvious deformity, normal ROM throughout Extremities: no edema, no cyanosis, no clubbing Pulses: 2+ symmetric, upper and lower extremities, normal cap refill Neurological: alert, oriented x 3, CN2-12 intact, strength normal upper extremities and lower extremities, sensation normal throughout, DTRs 2+ throughout, no cerebellar signs, gait normal Psychiatric: normal affect, behavior normal, pleasant  GU: 2 small slight raised lesions dorsal penis shaft distally, approx 68m diameter, suggestive of skin tags, otherwise  normal male external genitalia,circumcised, mild bilat varicocele, nontender, no masses, no hernia, no lymphadenopathy Rectal: deferred   Assessment and Plan :   Encounter Diagnoses  Name Primary?   Encounter for health maintenance examination in adult Yes   Allergic rhinitis, unspecified seasonality, unspecified trigger    Mild intermittent asthma without  complication    Acquired hypothyroidism    Obesity (BMI 30-39.9)    Mixed hyperlipidemia    History of sinusitis    Family history of hyperlipidemia    Family history of hypertension    Family history of diabetes mellitus (DM)    Environmental and seasonal allergies    Anxiety with depression    Screening for diabetes mellitus     This visit was a preventative care visit, also known as wellness visit or routine physical.   Topics typically include healthy lifestyle, diet, exercise, preventative care, vaccinations, sick and well care, proper use of emergency dept and after hours care, as well as other concerns.     Recommendations: Continue to return yearly for your annual wellness and preventative care visits.  This gives [facility] chance to discuss healthy lifestyle, exercise, vaccinations, review your chart record, and perform screenings where appropriate.  I recommend you see your eye doctor yearly for routine vision care.  I recommend you see your dentist yearly for routine dental care including hygiene visits twice yearly.   Vaccination recommendations were reviewed Immunization History  Administered Date(s) Administered   Janssen (J&J) SARS-COV-2 Vaccination 03/08/2020   Tdap  04/21/2018   He declines flu shot and HPV today   Screening for cancer: Colon cancer screening: Age 26  Testicular cancer screening You should do a monthly self testicular exam if you are between 64-36 years old  We discussed PSA, prostate exam, and prostate cancer screening risks/benefits.   Age 35  Skin cancer screening: Check your skin regularly for new changes, growing lesions, or other lesions of concern Come in for evaluation if you have skin lesions of concern.  Lung cancer screening: If you have a greater than 20 pack year history of tobacco use, then you may qualify for lung cancer screening with a chest CT scan.   Please call your insurance company to inquire about coverage for this  test.  We currently don't have screenings for other cancers besides breast, cervical, colon, and lung cancers.  If you have a strong family history of cancer or have other cancer screening concerns, please let me know.    Bone health: Get at least 150 minutes of aerobic exercise weekly Get weight bearing exercise at least once weekly Bone density test:  A bone density test is an imaging test that uses a type of X-ray to measure the amount of calcium and other minerals in your bones. The test may be used to diagnose or screen you for a condition that causes weak or thin bones (osteoporosis), predict your risk for a broken bone (fracture), or determine how well your osteoporosis treatment is working. The bone density test is recommended for females 69 and older, or females or males <11 if certain risk factors such as thyroid disease, long term use of steroids such as for asthma or rheumatological issues, vitamin D deficiency, estrogen deficiency, family history of osteoporosis, self or family history of fragility fracture in first degree relative.    Heart health: Get at least 150 minutes of aerobic exercise weekly Limit alcohol It is important to maintain a healthy blood pressure and healthy cholesterol numbers  Heart disease screening: Screening for heart disease includes screening for blood pressure, fasting lipids, glucose/diabetes screening, BMI height to weight ratio, reviewed of smoking status, physical activity, and diet.    Goals include blood pressure 120/80 or less, maintaining a healthy lipid/cholesterol profile, preventing diabetes or keeping diabetes numbers under good control, not smoking or using tobacco products, exercising most days per week or at least 150 minutes per week of exercise, and eating healthy variety of fruits and vegetables, healthy oils, and avoiding unhealthy food choices like fried food, fast food, high sugar and high cholesterol foods.    Other tests may  possibly include EKG test, CT coronary calcium score, echocardiogram, exercise treadmill stress test.    Medical care options: I recommend you continue to seek care here first for routine care.  We try really hard to have available appointments Monday through Friday daytime hours for sick visits, acute visits, and physicals.  Urgent care should be used for after hours and weekends for significant issues that cannot wait till the next day.  The emergency department should be used for significant potentially life-threatening emergencies.  The emergency department is expensive, can often have long wait times for less significant concerns, so try to utilize primary care, urgent care, or telemedicine when possible to avoid unnecessary trips to the emergency department.  Virtual visits and telemedicine have been introduced since the pandemic started in 2020, and can be convenient ways to receive medical care.  We offer virtual appointments as well to assist you in  a variety of options to seek medical care.   Separate significant issues discussed: Counseled on lifestyle changes ,healthy diet, regular exercise  Asthma - continue inhaler prn, mild intermittent  Hyperlipidemia on prior labs - updated labs today  Skin lesion/tag of penis - can return for excision if desired  Hypothyroidism -- updated labs today   Ke[place] was seen today for fasting cpe, .  Diagnoses and all orders for this visit:  Encounter for health maintenance examination in adult -     Comprehensive metabolic panel -     CBC -     Lipid panel -     TSH + free T4 -     Hemoglobin A1c  Allergic rhinitis, unspecified seasonality, unspecified trigger  Mild intermittent asthma without complication  Acquired hypothyroidism -     TSH + free T4  Obesity (BMI 30-39.9)  Mixed hyperlipidemia -     Lipid panel  History of sinusitis  Family history of hyperlipidemia  Family history of hypertension  Family history of  diabetes mellitus (DM)  Environmental and seasonal allergies  Anxiety with depression  Screening for diabetes mellitus -     Hemoglobin A1c    Follow-up pending labs, yearly for physical

## 2022-08-30 LAB — CBC
Hematocrit: 45.8 % (ref 37.5–51.0)
Hemoglobin: 16.2 g/dL (ref 13.0–17.7)
MCH: 29.3 pg (ref 26.6–33.0)
MCHC: 35.4 g/dL (ref 31.5–35.7)
MCV: 83 fL (ref 79–97)
Platelets: 366 10*3/uL (ref 150–450)
RBC: 5.52 x10E6/uL (ref 4.14–5.80)
RDW: 12.6 % (ref 11.6–15.4)
WBC: 7.7 10*3/uL (ref 3.4–10.8)

## 2022-08-30 LAB — LIPID PANEL
Chol/HDL Ratio: 5.7 ratio — ABNORMAL HIGH (ref 0.0–5.0)
Cholesterol, Total: 211 mg/dL — ABNORMAL HIGH (ref 100–199)
HDL: 37 mg/dL — ABNORMAL LOW (ref >39)
LDL Chol Calc (NIH): 143 mg/dL — ABNORMAL HIGH (ref 0–99)
Triglycerides: 171 mg/dL — ABNORMAL HIGH (ref 0–149)
VLDL Cholesterol Cal: 31 mg/dL (ref 5–40)

## 2022-08-30 LAB — COMPREHENSIVE METABOLIC PANEL
ALT: 31 IU/L (ref 0–44)
AST: 27 IU/L (ref 0–40)
Albumin/Globulin Ratio: 2.1 (ref 1.2–2.2)
Albumin: 5.1 g/dL (ref 4.3–5.2)
Alkaline Phosphatase: 55 IU/L (ref 44–121)
BUN/Creatinine Ratio: 11 (ref 9–20)
BUN: 11 mg/dL (ref 6–20)
Bilirubin Total: 0.5 mg/dL (ref 0.0–1.2)
CO2: 23 mmol/L (ref 20–29)
Calcium: 9.9 mg/dL (ref 8.7–10.2)
Chloride: 98 mmol/L (ref 96–106)
Creatinine, Ser: 0.98 mg/dL (ref 0.76–1.27)
Globulin, Total: 2.4 g/dL (ref 1.5–4.5)
Glucose: 80 mg/dL (ref 70–99)
Potassium: 4.1 mmol/L (ref 3.5–5.2)
Sodium: 138 mmol/L (ref 134–144)
Total Protein: 7.5 g/dL (ref 6.0–8.5)
eGFR: 108 mL/min/{1.73_m2} (ref >59)

## 2022-08-30 LAB — HEMOGLOBIN A1C
Est. average glucose Bld gHb Est-mCnc: 108 mg/dL
Hgb A1c MFr Bld: 5.4 % (ref 4.8–5.6)

## 2022-08-30 LAB — TSH+FREE T4
Free T4: 1.73 ng/dL (ref 0.82–1.77)
TSH: 1.84 u[IU]/mL (ref 0.450–4.500)

## 2022-09-25 ENCOUNTER — Encounter: Payer: BC Managed Care – PPO | Admitting: Nurse Practitioner

## 2022-09-25 NOTE — Progress Notes (Signed)
Out of [place]- [place] out

## 2022-09-27 ENCOUNTER — Encounter: Payer: Self-pay | Admitting: Family Medicine

## 2022-09-27 ENCOUNTER — Telehealth: Payer: BC Managed Care – PPO | Admitting: Emergency Medicine

## 2022-09-27 ENCOUNTER — Telehealth: Payer: BC Managed Care – PPO | Admitting: Family Medicine

## 2022-09-27 VITALS — Temp 99.4°F | Ht 67.0 in | Wt 232.0 lb

## 2022-09-27 DIAGNOSIS — J4541 Moderate persistent asthma with (acute) exacerbation: Secondary | ICD-10-CM | POA: Diagnosis not present

## 2022-09-27 DIAGNOSIS — R059 Cough, unspecified: Secondary | ICD-10-CM

## 2022-09-27 MED ORDER — AZITHROMYCIN 250 MG PO TABS: ORAL_TABLET | ORAL | 0 refills | Status: DC

## 2022-09-27 MED ORDER — PREDNISONE 10 MG (21) PO TBPK: ORAL_TABLET | ORAL | 0 refills | Status: DC

## 2022-09-27 NOTE — Patient Instructions (Addendum)
Stay well hydrated--drink plenty of water. Take the z-pak antibiotic and the prednisone as directed. You can consider using Mucinex DM or Robitussin DM. You can call us for an additional cough medication if needed. Contact us if you have persistent fever, worsening shortness of breath, persistent or worsening cough, pain with breathing, etc. Use ibuprofen or tylenol as needed for pain, and you can use warm compresses to help with the soreness from coughing.  Consider having spirometry (lung function testing) when you are well, to see if you need any additional asthma medications regularly.

## 2022-09-27 NOTE — Progress Notes (Signed)
Because of your difficulty breathing and chest pain, I feel your condition warrants further evaluation and I recommend that you be seen in a face to face visit.   NOTE: There will be NO CHARGE for this eVisit   If you are having a true medical emergency please call 911.      For an urgent face to face visit, [place] has seven urgent care centers for your convenience:     [facility] [place] at  Get Driving Directions [phone] [address] [place], [place] [address]    [facility]) Get Driving Directions [phone] [place], [place] [address]  [facility] [place] ([place]) Get Driving Directions [phone] [address] [place],  [place]  [address]  [place] [facility] - at [address] Get Driving Directions  [phone] [address] [place],  [place] [address]   [facility] Get Driving Directions [phone] [address] [place], [place], [place] [address]   [facility] at [facility] Get Driving Directions  [phone] [address] [place], [place] [address]   [facility] [place] Get Driving Directions [phone] [address], [place], [place] [address]  Your MyChart E-visit questionnaire answers were reviewed by a board certified advanced clinical practitioner to complete your personal care plan based on your specific symptoms.  Thank you for using e-Visits.

## 2022-09-27 NOTE — Progress Notes (Signed)
Start time: 9:36 End time: 9:56  Virtual Visit via Video Note  I connected with Renwick on 09/27/22 by a video enabled telemedicine application and verified that I am speaking with the correct person using two identifiers.  Location: Patient: home Provider: office   I discussed the limitations of evaluation and management by telemedicine and the availability of in person appointments. The patient expressed understanding and agreed to proceed.  History of Present Illness:  Chief Complaint  Patient presents with   Cough    VIRTUAL bad cough, has sinus pain and pressure. Just took a home covid test and it was negative. Symptoms started Sunday. Mucus is brown in color from his nose. No fever, chills or body aches.    12/8 he started feeling bad.  He developed "typical cold symptoms" over the weekend. Co-workers were all sick, all tested negative for COVID. He felt fine Monday, but that night, on 12/11 "it went straight to my lungs", started coughing. Much worse since he woke up 12/12. Denies sinus pain, nose is no longer running.  Nasal drainage when blowing the nose is brown/red. +cough--nonproductive, but feels like chest is very congested. Denies shortness of breath. Increased coughing with deep breaths, laughing. Soreness in body related to coughing. No known fevers (found to have LG today), no chills. Home COVID test was negative this morning.  H/o frequent bronchitis similar to this 12-13x in his lifetime, never gets better on its own when it gets to this point. Asthma--mostly allergy and exercise-induced, and flares when he is sick. Albuterol helps for a couple of hours, using it every 6 hours. Last flare was 2019  PMH, PSH, SH reviewed Asthma and allergies   Outpatient Encounter Medications as of 09/27/2022  Medication Sig Note   albuterol (VENTOLIN HFA) 108 (90 Base) MCG/ACT inhaler Inhale 1-2 puffs into the lungs every 6 (six) hours as needed. 09/27/2022:  Used last night   escitalopram (LEXAPRO) 10 MG tablet Take 0.5 tablets (5 mg total) by mouth daily.    ibuprofen (ADVIL) 200 MG tablet Take 400 mg by mouth every 6 (six) hours as needed. 09/27/2022: Last dose 3pm yesterday   levothyroxine (SYNTHROID) 50 MCG tablet TAKE 1 TABLET(50 MCG) BY MOUTH DAILY BEFORE AND BREAKFAST    montelukast (SINGULAIR) 10 MG tablet TAKE 1 TABLET(10 MG) BY MOUTH DAILY    meloxicam (MOBIC) 15 MG tablet Take 1 tablet (15 mg total) by mouth daily. (Patient not taking: Reported on 09/27/2022) 09/27/2022: Uses as needed   No facility-administered encounter medications on file as of 09/27/2022.   Allergies  Allergen Reactions   Doxycycline Itching    ROSACEA   Levaquin  [Levofloxacin In D5w] Rash   Aloe Rash   ROS:  URI symptoms per HPI.  No n/v/d, rash, HA or dizziness. No chest pain, shortness of breath.    Observations/Objective:  Temp 99.4 F (37.4 C) (Oral)   Ht '5\' 7"'$  (1.702 m)   Wt 232 lb (105.2 kg)   BMI 36.34 kg/m   Mildly ill appearing male--sounds congested, with very frequent coughing spells during visit. Speaking comfortably.  In no acute distress He is alert and oriented. Cranial nerves grossly intact. Exam is limited due to the virtual nature of the visit.   Assessment and Plan:  Moderate persistent asthmatic bronchitis with acute exacerbation - risks/SE prednisone reviewed. cont albuterol prn, Mucinex/guaifenesin DM. f/u if sx persist or worsen (in person) - Plan: azithromycin (ZITHROMAX) 250 MG tablet, predniSONE (STERAPRED UNI-PAK 21 TAB)  10 MG (21) TBPK tablet   Stay well hydrated--drink plenty of water. Take the z-pak antibiotic and the prednisone as directed. You can consider using Mucinex DM or Robitussin DM. You can call us for an additional cough medication if needed. Contact us if you have persistent fever, worsening shortness of breath, persistent or worsening cough, pain with breathing, etc. Use ibuprofen or tylenol as  needed for pain, and you can use warm compresses to help with the soreness from coughing.   Consider having spirometry (lung function testing) when you are well, to see if you need any additional asthma medications regularly.  Follow Up Instructions:    I discussed the assessment and treatment plan with the patient. The patient was provided an opportunity to ask questions and all were answered. The patient agreed with the plan and demonstrated an understanding of the instructions.   The patient was advised to call back or seek an in-person evaluation if the symptoms worsen or if the condition fails to improve as anticipated.  I spent 23 minutes dedicated to the care of this patient, including pre-visit review of records, face to face time, post-visit ordering of testing and documentation.    Vikki Ports, MD

## 2022-10-03 ENCOUNTER — Encounter: Payer: Self-pay | Admitting: Family Medicine

## 2022-10-03 ENCOUNTER — Ambulatory Visit (INDEPENDENT_AMBULATORY_CARE_PROVIDER_SITE_OTHER): Payer: BC Managed Care – PPO | Admitting: Family Medicine

## 2022-10-03 VITALS — BP 120/70 | HR 84 | Temp 99.3°F | Ht 68.0 in | Wt 234.6 lb

## 2022-10-03 DIAGNOSIS — R059 Cough, unspecified: Secondary | ICD-10-CM

## 2022-10-03 DIAGNOSIS — R509 Fever, unspecified: Secondary | ICD-10-CM

## 2022-10-03 DIAGNOSIS — R0781 Pleurodynia: Secondary | ICD-10-CM

## 2022-10-03 NOTE — Patient Instructions (Signed)
Stay well hydrated. Start the Mucinex DM, but you need to STOP the Delsym syrup (they both contain the same cough suppressant). Given the nasal drainage and congestion, you may want to also take pseudoephedrine (Sudafed behind the counter). Use moist heat and continue the ibuprofen for your rib pain. Your lungs sounded clear, but the persistent low grade fever concerns me. If you aren't significantly better by tomorrow, please go to [place] at [facility] to get a chest x-ray.

## 2022-10-03 NOTE — Progress Notes (Signed)
Chief Complaint  Patient presents with   Fever    Not feeling much better, says his chest congestion is a little better. Thinks he did somehting to his rib on left side. Woke up with fever of 100 today,    He was seen 12/14 via virtual visit. He was treated for asthmatic bronchitis with a course of prednisone and a z-pak.  He presents today with complaint of left side pain. He had acute onset of pain at the L lower ribs after coughing yesterday, around 5pm last night.  Felt a pop. Denies any improvement today.  Ibuprofen helps some, took 2 this morning. T100 prior to taking ibuprofen today.  Breathing and cough are somewhat improved.  He is still coughing, "one of the worst I ever had". Cough is nonproductive, though chest feels less congested.  He is no longer wheezing and needing to use his inhaler as often as he was last week. He notes some increased sinus congestion.  Nasal drainage is mostly clearish-white. No longer seeing blood.  Took Delsym this morning. Bought Mucinex DM but hasn't taken yet.  As discussed last week--sx started 12/8, URI symptoms over the weekend, 12/11 started coughing, got worse 12/12.  He had chest congestion but nonproductive cough, nasal drainage was brown/red, no sinus pain.  He had increased cough with deep breaths, laughing, and at that time reported some soreness in body from coughing.  He had no known fevers or chills (was LG the morning of visit 12/14), no shortness of breath (but cough improved with albuterol), and had a negative COVID test that morning 12/14. He has h/o similar bronchitis episodes many times in his life, none since 2019.  H/o asthma which flares with illness, allergies and exercise-induced.    PMH, PSH, SH reviewed  Outpatient Encounter Medications as of 10/03/2022  Medication Sig Note   albuterol (VENTOLIN HFA) 108 (90 Base) MCG/ACT inhaler Inhale 1-2 puffs into the lungs every 6 (six) hours as needed. 10/03/2022: Used last night, 2-3x/d a  few days ago   escitalopram (LEXAPRO) 10 MG tablet Take 0.5 tablets (5 mg total) by mouth daily.    ibuprofen (ADVIL) 200 MG tablet Take 400 mg by mouth every 6 (six) hours as needed. 10/03/2022: Took '400mg'$  @ 8:30am   levothyroxine (SYNTHROID) 50 MCG tablet TAKE 1 TABLET(50 MCG) BY MOUTH DAILY BEFORE AND BREAKFAST    montelukast (SINGULAIR) 10 MG tablet TAKE 1 TABLET(10 MG) BY MOUTH DAILY    azithromycin (ZITHROMAX) 250 MG tablet Take 2 tablets by mouth on first day, then 1 tablet by mouth on days 2 through 5 (Patient not taking: Reported on 10/03/2022) 10/03/2022: Took the last pill 2 days ago   meloxicam (MOBIC) 15 MG tablet Take 1 tablet (15 mg total) by mouth daily. (Patient not taking: Reported on 09/27/2022) 09/27/2022: Uses as needed   predniSONE (STERAPRED UNI-PAK 21 TAB) 10 MG (21) TBPK tablet Take with food, as directed (Patient not taking: Reported on 10/03/2022) 10/03/2022: Took last one yesterday   No facility-administered encounter medications on file as of 10/03/2022.   Allergies  Allergen Reactions   Doxycycline Itching    ROSACEA   Levaquin  [Levofloxacin In D5w] Rash   Aloe Rash   ROS: persistent LG fever.  No n/v/d. Wheezing improving.  Still coughing. L side pain. No bleeding, bruising, rashes.  No joint pains.   PHYSICAL EXAM:  BP 120/70   Pulse 84   Temp 99.3 F (37.4 C) (Tympanic)   Ht 5'  8" (1.727 m)   Wt 234 lb 9.6 oz (106.4 kg)   BMI 35.67 kg/m   Pleasant male, speaking comfortably.  Rare cough, mainly throat-clearing today. HEENT: conjunctiva and sclera are clear, some clear mucus and edema on the left, no erythema or sinus tenderness. OP is clear Neck: no lymphadenopathy, thyromegaly or mass Heart: regular rate and rhythm, no murmur Lungs: clear bilaterally. No wheezes, rales, ronchi Chest well--tender along inferolateral floating ribs on the left. No bony stepoffs, more diffusely tender Abdomen: soft, nontender Extremities: no edema Neuro: alert  and oriented, cranial nerves grossly intact. Psych: normal mood, affect.   ASSESSMENT/PLAN:  Cough, unspecified type - improving. still has some persistent cough and LG fever. Consider CXR (declines today, will go if not improving) - Plan: DG Chest 2 View  Fever, unspecified fever cause - zpak still in system, but has persistent LG fever.  Overall clinically somewhat improved.  Understands to go for CXR if persists - Plan: DG Chest 2 View  Rib pain on left side - suspect MSK (given acute onset and "pop"); ddx includes pna. To go for CXR if not improving in 1-2d - Plan: DG Chest 2 View  Stay well hydrated. Start the Mucinex DM, but you need to STOP the Delsym syrup (they both contain the same cough suppressant). Given the nasal drainage and congestion, you may want to also take pseudoephedrine (Sudafed behind the counter). Use moist heat and continue the ibuprofen for your rib pain. Your lungs sounded clear, but the persistent low grade fever concerns me. If you aren't significantly better by tomorrow, please go to [place] [facility] to get a chest x-ray.

## 2022-11-07 ENCOUNTER — Ambulatory Visit: Payer: BC Managed Care – PPO | Admitting: Family Medicine

## 2022-11-07 ENCOUNTER — Encounter: Payer: Self-pay | Admitting: Family Medicine

## 2022-11-07 VITALS — BP 122/80 | HR 80 | Temp 98.9°F | Ht 68.0 in | Wt 232.6 lb

## 2022-11-07 DIAGNOSIS — D229 Melanocytic nevi, unspecified: Secondary | ICD-10-CM | POA: Diagnosis not present

## 2022-11-07 NOTE — Progress Notes (Signed)
Chief Complaint  Patient presents with   Nevus    Mole on left upper arm that has changed. Has become swollen, itchy and started bleeding. Audelia Acton told him to keep an eye on it at his CPE in Nov.    Mole on L upper arm--used to be flat, light brown, like other moles that he has. In September he noticed the color had changed some (darker area within it), slightly bigger, and was itchy. Sunday, he noticed blood while showering. It remains itchy. Denies scratching at it or any trauma prior to bleeding.   No FH melanoma, +skin cancers. Unaware of severe sunburns as a child  PMH, PSH, SH reviewed  Outpatient Encounter Medications as of 11/07/2022  Medication Sig Note   escitalopram (LEXAPRO) 10 MG tablet Take 0.5 tablets (5 mg total) by mouth daily.    levothyroxine (SYNTHROID) 50 MCG tablet TAKE 1 TABLET(50 MCG) BY MOUTH DAILY BEFORE AND BREAKFAST    montelukast (SINGULAIR) 10 MG tablet TAKE 1 TABLET(10 MG) BY MOUTH DAILY    albuterol (VENTOLIN HFA) 108 (90 Base) MCG/ACT inhaler Inhale 1-2 puffs into the lungs every 6 (six) hours as needed. (Patient not taking: Reported on 11/07/2022) 11/07/2022: prn   meloxicam (MOBIC) 15 MG tablet Take 1 tablet (15 mg total) by mouth daily. (Patient not taking: Reported on 09/27/2022) 09/27/2022: Uses as needed   [DISCONTINUED] ibuprofen (ADVIL) 200 MG tablet Take 400 mg by mouth every 6 (six) hours as needed. (Patient not taking: Reported on 11/07/2022)    No facility-administered encounter medications on file as of 11/07/2022.   Allergies  Allergen Reactions   Doxycycline Itching    ROSACEA   Levaquin  [Levofloxacin In D5w] Rash   Aloe Rash   ROS: no fever, URI symptoms, rashes or other concerns. Denies bleeding or bruising.  Hasn't taken any NSAIDs recently. Mole is itchy.  PHYSICAL EXAM:  BP 122/80   Pulse 80   Temp 98.9 F (37.2 C)   Ht '5\' 8"'$  (1.727 m)   Wt 232 lb 9.6 oz (105.5 kg)   BMI 35.37 kg/m   Well-appearing, pleasant male in no  distress. He is alert and oriented. Skin: some benign nevi noted on R UE. L upper arm, laterally, there is a 3.5 x 68m lesion that is his area of concern. Brown pigmentation noted at the top.  Central portion is excoraiated/denuded, slight darkening around the edges/scab Surrounding tissue is normal. Other benign nevi noted.    ASSESSMENT/PLAN:  Atypical mole - Plan: Dermatology pathology  Verbal consent obtained for skin biopsy. We discussed alternatives, including using bacitracin ointment and return for recheck in 1 week (with biopsy then, if indicated). He prefers to do biopsy today. Understands risks to include bleeding, infection and scarring.  Skin was cleansed with betadine, anesthetized with 1% lidocaine with epinephrine. 660mpunch biopsy taken.  Skin edges closed with one 4-0 suture. Bleeding was minimal and controlled. Bacitracin and bandage was applied.  Wound care instructions were reviewed. F/u 10-12 days for suture removal, sooner if s/sx of infection.

## 2022-11-18 NOTE — Progress Notes (Unsigned)
No chief complaint on file.  Patient presents for suture removal. He underwent punch biopsy of a lesion on his LUE on 11/07/22.  Pathology:  Diagnosis Skin , left upper arm MELANOCYTIC NEVUS, COMPOUND TYPE WITH SCAR, LIMITED MARGINS FREE, SEE DESCRIPTION Microscopic Description There is a proliferation of banal melanocytes arranged predominantly as nests at the dermal epidermal junction as well as in the dermis. There is no atypia. There is fibrosis in the dermis with recurrent-nevus-like changes suggesting that this lesion has been traumatized. There is hemorrhage which may account for pigmentation noted clinically. After H and E review, a MITF Stain was performed to highlight the melanocytes in this lesion. Controls stained appropriately. There is no immunoreactivity for PRAME. On limited margin evaluation, the edges appear free. Multiple sections have been examined.   PMH, PSH, SH reviewed   ROS: no fever, URI symptoms, bleeding, rash or other concerns   PHYSICAL EXAM:  There were no vitals taken for this visit.   LUE:   ASSESSMENT/PLAN:

## 2022-11-19 ENCOUNTER — Encounter: Payer: Self-pay | Admitting: Family Medicine

## 2022-11-19 ENCOUNTER — Ambulatory Visit (INDEPENDENT_AMBULATORY_CARE_PROVIDER_SITE_OTHER): Payer: BC Managed Care – PPO | Admitting: Family Medicine

## 2022-11-19 VITALS — BP 118/74 | HR 72 | Ht 68.0 in | Wt 235.2 lb

## 2022-11-19 DIAGNOSIS — Z4802 Encounter for removal of sutures: Secondary | ICD-10-CM

## 2022-11-19 DIAGNOSIS — D2262 Melanocytic nevi of left upper limb, including shoulder: Secondary | ICD-10-CM

## 2022-12-17 ENCOUNTER — Ambulatory Visit: Payer: BC Managed Care – PPO | Admitting: Family Medicine

## 2022-12-19 ENCOUNTER — Other Ambulatory Visit: Payer: Self-pay | Admitting: Physician Assistant

## 2022-12-19 DIAGNOSIS — J309 Allergic rhinitis, unspecified: Secondary | ICD-10-CM

## 2022-12-19 DIAGNOSIS — E039 Hypothyroidism, unspecified: Secondary | ICD-10-CM

## 2022-12-19 DIAGNOSIS — J4520 Mild intermittent asthma, uncomplicated: Secondary | ICD-10-CM

## 2022-12-19 NOTE — Telephone Encounter (Signed)
Pt has an appt in May

## 2023-01-07 ENCOUNTER — Telehealth: Payer: BC Managed Care – PPO | Admitting: Physician Assistant

## 2023-01-07 DIAGNOSIS — B9689 Other specified bacterial agents as the cause of diseases classified elsewhere: Secondary | ICD-10-CM

## 2023-01-07 DIAGNOSIS — J019 Acute sinusitis, unspecified: Secondary | ICD-10-CM | POA: Diagnosis not present

## 2023-01-07 MED ORDER — AMOXICILLIN-POT CLAVULANATE 875-125 MG PO TABS: 1.0000 | ORAL_TABLET | Freq: Two times a day (BID) | ORAL | 0 refills | Status: DC

## 2023-01-07 NOTE — Progress Notes (Signed)

## 2023-02-27 ENCOUNTER — Ambulatory Visit: Payer: BC Managed Care – PPO | Admitting: Medical

## 2023-02-27 ENCOUNTER — Encounter: Payer: Self-pay | Admitting: Medical

## 2023-02-27 VITALS — BP 120/88 | HR 56 | Temp 98.0°F | Resp 16 | Wt 234.0 lb

## 2023-02-27 DIAGNOSIS — F418 Other specified anxiety disorders: Secondary | ICD-10-CM | POA: Diagnosis not present

## 2023-02-27 DIAGNOSIS — Z8249 Family history of ischemic heart disease and other diseases of the circulatory system: Secondary | ICD-10-CM

## 2023-02-27 DIAGNOSIS — E782 Mixed hyperlipidemia: Secondary | ICD-10-CM

## 2023-02-27 DIAGNOSIS — J4520 Mild intermittent asthma, uncomplicated: Secondary | ICD-10-CM

## 2023-02-27 DIAGNOSIS — Z6835 Body mass index (BMI) 35.0-35.9, adult: Secondary | ICD-10-CM

## 2023-02-27 DIAGNOSIS — E039 Hypothyroidism, unspecified: Secondary | ICD-10-CM | POA: Diagnosis not present

## 2023-02-27 DIAGNOSIS — Z83438 Family history of other disorder of lipoprotein metabolism and other lipidemia: Secondary | ICD-10-CM

## 2023-02-27 DIAGNOSIS — Z832 Family history of diseases of the blood and blood-forming organs and certain disorders involving the immune mechanism: Secondary | ICD-10-CM

## 2023-02-27 MED ORDER — FENOFIBRATE 145 MG PO TABS: 145.0000 mg | ORAL_TABLET | Freq: Every day | ORAL | 2 refills | Status: DC

## 2023-02-27 NOTE — Patient Instructions (Signed)
Call insurance about coverage for Qsymia or Contrave

## 2023-02-27 NOTE — Progress Notes (Signed)
Subjective:  Walter Spears is a 27 y.o. male who presents for Chief Complaint  Patient presents with   Medication Management     Here for 27mo med check  Compliant with thyroid medicaiton without c/o  Compliant with Lexapro, doing well with mood, no issues  No recent problems with allergies or asthma, no recent albuterol use.  Does use Singulair some this allergy season  Here for f/u on mixed dyslipidemia - no prior medicaiton.  A few years ago when TRIG were worse, he cut significantly down on alcohol and started exercising more which has been helpful  Doesn't eat a lot of red meat.   Eats some vegetarian during the week, eats nuts, avacado, oil or canola oil.    No other new issues  Dad may have a disorder with high red cells. He doesn't know specific diagnosis   No other aggravating or relieving factors.    No other c/o.  The following portions of the patient's history were reviewed and updated as appropriate: allergies, current medications, past family history, past medical history, past social history, past surgical history and problem list.  ROS Otherwise as in subjective above  Objective: BP 120/88   Pulse (!) 56   Temp 98 F (36.7 C) (Oral)   Resp 16   Wt 234 lb (106.1 kg)   SpO2 98% Comment: room air  BMI 35.58 kg/m   General appearance: alert, no distress, well developed, well nourished     Assessment: Encounter Diagnoses  Name Primary?   Mixed hyperlipidemia Yes   Mild intermittent asthma without complication    Acquired hypothyroidism    Anxiety with depression    Family history of hyperlipidemia    Family history of hypertension    BMI 35.0-35.9,adult    Family history of blood disorder      Plan: Mixed hyperlipidemia Discussed labs findings including elevated TRIG, low HDL, and high LDL for several years.   He has improved TRIG over time with less alcohol and improved healthy lipids in the diet, regular exercise Update lab today since  he has been incorporating more fruit in the diet Advised he begin trial of Fenofibrate, discussed risks/benefits of medication Declines dietician referral today  BMI > 35 - counseled on diet, exercise, strategies to lose weight.   Consider qysmia or contrave. He will see if insurance covers these  Anxiety with depression - stable on Lexapro 10mg  daily  Asthma - mild intermittent, no recent issues  Hypothyroidism - continue levothyroxine daily   [name] was seen today for medication management.  Diagnoses and all orders for this visit:  Mixed hyperlipidemia -     Lipid panel  Mild intermittent asthma without complication  Acquired hypothyroidism  Anxiety with depression  Family history of hyperlipidemia  Family history of hypertension  BMI 35.0-35.9,adult  Family history of blood disorder  Other orders -     fenofibrate (TRICOR) 145 MG tablet; Take 1 tablet (145 mg total) by mouth daily.    Follow up: pending labs

## 2023-02-28 ENCOUNTER — Other Ambulatory Visit: Payer: Self-pay | Admitting: Medical

## 2023-02-28 DIAGNOSIS — J4520 Mild intermittent asthma, uncomplicated: Secondary | ICD-10-CM

## 2023-02-28 DIAGNOSIS — G8929 Other chronic pain: Secondary | ICD-10-CM

## 2023-02-28 DIAGNOSIS — J309 Allergic rhinitis, unspecified: Secondary | ICD-10-CM

## 2023-02-28 DIAGNOSIS — E039 Hypothyroidism, unspecified: Secondary | ICD-10-CM

## 2023-02-28 DIAGNOSIS — F418 Other specified anxiety disorders: Secondary | ICD-10-CM

## 2023-02-28 LAB — LIPID PANEL
Chol/HDL Ratio: 5.4 ratio — ABNORMAL HIGH (ref 0.0–5.0)
Cholesterol, Total: 199 mg/dL (ref 100–199)
HDL: 37 mg/dL — ABNORMAL LOW (ref >39)
LDL Chol Calc (NIH): 132 mg/dL — ABNORMAL HIGH (ref 0–99)
Triglycerides: 169 mg/dL — ABNORMAL HIGH (ref 0–149)
VLDL Cholesterol Cal: 30 mg/dL (ref 5–40)

## 2023-02-28 MED ORDER — MELOXICAM 15 MG PO TABS: 15.0000 mg | ORAL_TABLET | Freq: Every day | ORAL | 0 refills | Status: DC

## 2023-02-28 MED ORDER — ESCITALOPRAM OXALATE 10 MG PO TABS: 5.0000 mg | ORAL_TABLET | Freq: Every day | ORAL | 2 refills | Status: DC

## 2023-02-28 MED ORDER — MONTELUKAST SODIUM 10 MG PO TABS: 10.0000 mg | ORAL_TABLET | Freq: Every day | ORAL | 3 refills | Status: DC

## 2023-02-28 MED ORDER — LEVOTHYROXINE SODIUM 50 MCG PO TABS: ORAL_TABLET | ORAL | 2 refills | Status: DC

## 2023-02-28 MED ORDER — ALBUTEROL SULFATE HFA 108 (90 BASE) MCG/ACT IN AERS: 1.0000 | INHALATION_SPRAY | Freq: Four times a day (QID) | RESPIRATORY_TRACT | 1 refills | Status: DC | PRN

## 2023-02-28 NOTE — Progress Notes (Signed)
Results sent through MyChart

## 2023-03-13 ENCOUNTER — Other Ambulatory Visit: Payer: Self-pay | Admitting: Physician Assistant

## 2023-03-13 DIAGNOSIS — F418 Other specified anxiety disorders: Secondary | ICD-10-CM

## 2023-03-27 ENCOUNTER — Other Ambulatory Visit: Payer: Self-pay | Admitting: Medical

## 2023-03-27 DIAGNOSIS — E039 Hypothyroidism, unspecified: Secondary | ICD-10-CM

## 2023-03-27 DIAGNOSIS — J309 Allergic rhinitis, unspecified: Secondary | ICD-10-CM

## 2023-03-27 DIAGNOSIS — J4520 Mild intermittent asthma, uncomplicated: Secondary | ICD-10-CM

## 2023-05-28 ENCOUNTER — Other Ambulatory Visit: Payer: Self-pay | Admitting: Medical

## 2023-05-28 ENCOUNTER — Telehealth: Payer: Self-pay | Admitting: Internal Medicine

## 2023-05-28 DIAGNOSIS — E782 Mixed hyperlipidemia: Secondary | ICD-10-CM

## 2023-05-28 NOTE — Telephone Encounter (Signed)
Pt has labs next week but no futures orders have been placed

## 2023-06-05 ENCOUNTER — Other Ambulatory Visit: Payer: BC Managed Care – PPO

## 2023-06-05 DIAGNOSIS — E782 Mixed hyperlipidemia: Secondary | ICD-10-CM

## 2023-06-06 LAB — LIPID PANEL
Chol/HDL Ratio: 4.7 ratio (ref 0.0–5.0)
Cholesterol, Total: 174 mg/dL (ref 100–199)
HDL: 37 mg/dL — ABNORMAL LOW (ref >39)
LDL Chol Calc (NIH): 112 mg/dL — ABNORMAL HIGH (ref 0–99)
Triglycerides: 142 mg/dL (ref 0–149)
VLDL Cholesterol Cal: 25 mg/dL (ref 5–40)

## 2023-06-06 NOTE — Progress Notes (Signed)
Results sent through MyChart

## 2023-06-21 ENCOUNTER — Other Ambulatory Visit: Payer: Self-pay | Admitting: Medical

## 2023-09-23 ENCOUNTER — Encounter: Payer: Self-pay | Admitting: Medical

## 2023-09-23 ENCOUNTER — Ambulatory Visit (INDEPENDENT_AMBULATORY_CARE_PROVIDER_SITE_OTHER): Payer: BC Managed Care – PPO | Admitting: Medical

## 2023-09-23 VITALS — BP 120/80 | HR 58 | Ht 68.0 in | Wt 220.4 lb

## 2023-09-23 DIAGNOSIS — E039 Hypothyroidism, unspecified: Secondary | ICD-10-CM

## 2023-09-23 DIAGNOSIS — E782 Mixed hyperlipidemia: Secondary | ICD-10-CM

## 2023-09-23 DIAGNOSIS — Z7185 Encounter for immunization safety counseling: Secondary | ICD-10-CM

## 2023-09-23 DIAGNOSIS — Z0000 Encounter for general adult medical examination without abnormal findings: Secondary | ICD-10-CM

## 2023-09-23 DIAGNOSIS — F418 Other specified anxiety disorders: Secondary | ICD-10-CM

## 2023-09-23 DIAGNOSIS — J309 Allergic rhinitis, unspecified: Secondary | ICD-10-CM

## 2023-09-23 NOTE — Progress Notes (Signed)
Subjective:   HPI  Walter Spears is a 27 y.o. male who presents for Chief Complaint  Patient presents with   Annual Exam    Fasting cpe, no concerns. Declines flu shot     Patient Care Team: Jaryah Aracena, Kermit Balo, PA-C as PCP - General (Family Medicine) Sees dentist Sees eye doctor   Concerns: Doing fine  Compliant with lexapro, no issues.  Mood ok  Compliant with fenofibrate without c/o  Compliant with thyroid medication.  Doing ok.    Asthma - no problems since illness in march.  No frequency asthma or allergy issues   Reviewed their medical, surgical, family, social, medication, and allergy history and updated chart as appropriate.  Past Medical History:  Diagnosis Date   Asthma    Depression    Dyslipidemia with elevated low density lipoprotein (LDL) cholesterol and abnormally low high density lipoprotein (HDL) cholesterol 12/02/2020   Environmental and seasonal allergies    History of sinusitis 11/21/2017   Hypertriglyceridemia    Left varicocele 11/21/2017   Thyroid disease     Past Surgical History:  Procedure Laterality Date   ADENOIDECTOMY     TONSILLECTOMY     WISDOM TOOTH EXTRACTION      Family History  Problem Relation Age of Onset   Brain cancer Mother        unsure if cancer    Hypertension Father    Hyperlipidemia Father    Hypertension Paternal Grandmother    Heart disease Paternal Grandfather    Diabetes Paternal Grandfather    COPD Paternal Grandfather      Current Outpatient Medications:    albuterol (VENTOLIN HFA) 108 (90 Base) MCG/ACT inhaler, Inhale 1-2 puffs into the lungs every 6 (six) hours as needed., Disp: 8 g, Rfl: 1   escitalopram (LEXAPRO) 10 MG tablet, Take 0.5 tablets (5 mg total) by mouth daily., Disp: 90 tablet, Rfl: 2   fenofibrate (TRICOR) 145 MG tablet, TAKE 1 TABLET BY MOUTH EVERY DAY, Disp: 90 tablet, Rfl: 1   levothyroxine (SYNTHROID) 50 MCG tablet, TAKE 1 TABLET(50 MCG) BY MOUTH DAILY BEFORE BREAKFAST, Disp: 90  tablet, Rfl: 1   meloxicam (MOBIC) 15 MG tablet, Take 1 tablet (15 mg total) by mouth daily. (Patient not taking: Reported on 09/23/2023), Disp: 90 tablet, Rfl: 0  Allergies  Allergen Reactions   Doxycycline Itching    ROSACEA   Levaquin  [Levofloxacin In D5w] Rash   Aloe Rash    Review of Systems  Constitutional:  Negative for chills, fever, malaise/fatigue and weight loss.  HENT:  Negative for congestion, ear pain, hearing loss, sore throat and tinnitus.   Eyes:  Negative for blurred vision, pain and redness.  Respiratory:  Negative for cough, hemoptysis and shortness of breath.   Cardiovascular:  Negative for chest pain, palpitations, orthopnea, claudication and leg swelling.  Gastrointestinal:  Negative for abdominal pain, blood in stool, constipation, diarrhea, nausea and vomiting.  Genitourinary:  Negative for dysuria, flank pain, frequency, hematuria and urgency.  Musculoskeletal:  Negative for falls, joint pain and myalgias.  Skin:  Negative for itching and rash.  Neurological:  Negative for dizziness, tingling, speech change, weakness and headaches.  Endo/Heme/Allergies:  Negative for polydipsia. Does not bruise/bleed easily.  Psychiatric/Behavioral:  Negative for depression and memory loss. The patient is not nervous/anxious and does not have insomnia.          09/23/2023    3:30 PM 08/29/2022    3:09 PM 12/18/2021    8:58 AM  06/14/2021    8:38 AM 12/02/2020   11:34 AM  Depression screen PHQ 2/9  Decreased Interest 0 0 0 0 0  Down, Depressed, Hopeless 0 0 0 0 0  PHQ - 2 Score 0 0 0 0 0  Altered sleeping 0 0     Tired, decreased energy 0 0     Change in appetite 0 0     Feeling bad or failure about yourself  0 0     Trouble concentrating 0 0     Moving slowly or fidgety/restless 0 0     Suicidal thoughts 0 0     PHQ-9 Score 0 0     Difficult doing work/chores Not difficult at all Not difficult at all          Objective:  BP 120/80   Pulse (!) 58   Ht 5\' 8"   (1.727 m)   Wt 220 lb 6.4 oz (100 kg)   BMI 33.51 kg/m   General appearance: alert, no distress, WD/WN, Caucasian male Skin: scattered macules, no worrisome lesions HEENT: normocephalic, conjunctiva/corneas normal, sclerae anicteric, PERRLA, EOMi, nares patent, no discharge or erythema, pharynx normal Oral cavity: MMM, tongue normal, teeth in good repair Neck: supple, no lymphadenopathy, no thyromegaly, no masses, normal ROM, no bruits Chest: non tender, normal shape and expansion Heart: RRR, normal S1, S2, no murmurs Lungs: CTA bilaterally, no wheezes, rhonchi, or rales Abdomen: +bs, soft, non tender, non distended, no masses, no hepatomegaly, no splenomegaly, no bruits Back: non tender, normal ROM, no scoliosis Musculoskeletal: upper extremities non tender, no obvious deformity, normal ROM throughout, lower extremities non tender, no obvious deformity, normal ROM throughout Extremities: no edema, no cyanosis, no clubbing Pulses: 2+ symmetric, upper and lower extremities, normal cap refill Neurological: alert, oriented x 3, CN2-12 intact, strength normal upper extremities and lower extremities, sensation normal throughout, DTRs 2+ throughout, no cerebellar signs, gait normal Psychiatric: normal affect, behavior normal, pleasant  GU:  normal male external genitalia,circumcised, mild bilat varicocele, nontender, no masses, no hernia, no lymphadenopathy Rectal: deferred   Assessment and Plan :   Encounter Diagnoses  Name Primary?   Encounter for health maintenance examination in adult Yes   Vaccine counseling    Acquired hypothyroidism    Mixed hyperlipidemia    Anxiety with depression    Allergic rhinitis, unspecified seasonality, unspecified trigger     This visit was a preventative care visit, also known as wellness visit or routine physical.   Topics typically include healthy lifestyle, diet, exercise, preventative care, vaccinations, sick and well care, proper use of emergency  dept and after hours care, as well as other concerns.     Recommendations: Continue to return yearly for your annual wellness and preventative care visits.  This gives [place] a chance to discuss healthy lifestyle, exercise, vaccinations, review your chart record, and perform screenings where appropriate.  I recommend you see your eye doctor yearly for routine vision care.  I recommend you see your dentist yearly for routine dental care including hygiene visits twice yearly.   Vaccination recommendations were reviewed Immunization History  Administered Date(s) Administered   Janssen (J&J) SARS-COV-2 Vaccination 03/08/2020   Tdap 04/21/2018    Screening for cancer: Colon cancer screening: Age 20  Testicular cancer screening You should do a monthly self testicular exam if you are between 52-72 years old  We discussed PSA, prostate exam, and prostate cancer screening risks/benefits.   Age 30  Skin cancer screening: Check your skin regularly for  new changes, growing lesions, or other lesions of concern Come in for evaluation if you have skin lesions of concern.  Lung cancer screening: If you have a greater than 20 pack year history of tobacco use, then you may qualify for lung cancer screening with a chest CT scan.   Please call your insurance company to inquire about coverage for this test.  We currently don't have screenings for other cancers besides breast, cervical, colon, and lung cancers.  If you have a strong family history of cancer or have other cancer screening concerns, please let me know.    Bone health: Get at least 150 minutes of aerobic exercise weekly Get weight bearing exercise at least once weekly Bone density test:  A bone density test is an imaging test that uses a type of X-ray to measure the amount of calcium and other minerals in your bones. The test may be used to diagnose or screen you for a condition that causes weak or thin bones (osteoporosis), predict  your risk for a broken bone (fracture), or determine how well your osteoporosis treatment is working. The bone density test is recommended for females 65 and older, or females or males <65 if certain risk factors such as thyroid disease, long term use of steroids such as for asthma or rheumatological issues, vitamin D deficiency, estrogen deficiency, family history of osteoporosis, self or family history of fragility fracture in first degree relative.    Heart health: Get at least 150 minutes of aerobic exercise weekly Limit alcohol It is important to maintain a healthy blood pressure and healthy cholesterol numbers  Heart disease screening: Screening for heart disease includes screening for blood pressure, fasting lipids, glucose/diabetes screening, BMI height to weight ratio, reviewed of smoking status, physical activity, and diet.    Goals include blood pressure 120/80 or less, maintaining a healthy lipid/cholesterol profile, preventing diabetes or keeping diabetes numbers under good control, not smoking or using tobacco products, exercising most days per week or at least 150 minutes per week of exercise, and eating healthy variety of fruits and vegetables, healthy oils, and avoiding unhealthy food choices like fried food, fast food, high sugar and high cholesterol foods.    Other tests may possibly include EKG test, CT coronary calcium score, echocardiogram, exercise treadmill stress test.    Medical care options: I recommend you continue to seek care here first for routine care.  We try really hard to have available appointments Monday through Friday daytime hours for sick visits, acute visits, and physicals.  Urgent care should be used for after hours and weekends for significant issues that cannot wait till the next day.  The emergency department should be used for significant potentially life-threatening emergencies.  The emergency department is expensive, can often have long wait times  for less significant concerns, so try to utilize primary care, urgent care, or telemedicine when possible to avoid unnecessary trips to the emergency department.  Virtual visits and telemedicine have been introduced since the pandemic started in 2020, and can be convenient ways to receive medical care.  We offer virtual appointments as well to assist you in a variety of options to seek medical care.   Separate significant issues discussed: Asthma - continue inhaler prn, mild intermittent  Hyperlipidemia - continue fenofibrate  Hypothyroidism -- updated labs today, continue current medication   Walter Spears was seen today for annual exam.  Diagnoses and all orders for this visit:  Encounter for health maintenance examination in adult -  TSH + free T4 -     CBC -     Comprehensive metabolic panel -     Lipid panel  Vaccine counseling  Acquired hypothyroidism -     TSH + free T4  Mixed hyperlipidemia -     Lipid panel  Anxiety with depression  Allergic rhinitis, unspecified seasonality, unspecified trigger    Follow-up pending labs, yearly for physical

## 2023-09-24 ENCOUNTER — Other Ambulatory Visit: Payer: Self-pay | Admitting: Medical

## 2023-09-24 DIAGNOSIS — E039 Hypothyroidism, unspecified: Secondary | ICD-10-CM

## 2023-09-24 DIAGNOSIS — F418 Other specified anxiety disorders: Secondary | ICD-10-CM

## 2023-09-24 LAB — COMPREHENSIVE METABOLIC PANEL
ALT: 29 [IU]/L (ref 0–44)
AST: 30 [IU]/L (ref 0–40)
Albumin: 4.8 g/dL (ref 4.3–5.2)
Alkaline Phosphatase: 47 [IU]/L (ref 44–121)
BUN/Creatinine Ratio: 10 (ref 9–20)
BUN: 10 mg/dL (ref 6–20)
Bilirubin Total: 0.5 mg/dL (ref 0.0–1.2)
CO2: 24 mmol/L (ref 20–29)
Calcium: 9.7 mg/dL (ref 8.7–10.2)
Chloride: 99 mmol/L (ref 96–106)
Creatinine, Ser: 0.99 mg/dL (ref 0.76–1.27)
Globulin, Total: 2.4 g/dL (ref 1.5–4.5)
Glucose: 84 mg/dL (ref 70–99)
Potassium: 4.5 mmol/L (ref 3.5–5.2)
Sodium: 139 mmol/L (ref 134–144)
Total Protein: 7.2 g/dL (ref 6.0–8.5)
eGFR: 106 mL/min/{1.73_m2} (ref >59)

## 2023-09-24 LAB — LIPID PANEL
Chol/HDL Ratio: 4.4 {ratio} (ref 0.0–5.0)
Cholesterol, Total: 198 mg/dL (ref 100–199)
HDL: 45 mg/dL (ref >39)
LDL Chol Calc (NIH): 131 mg/dL — ABNORMAL HIGH (ref 0–99)
Triglycerides: 120 mg/dL (ref 0–149)
VLDL Cholesterol Cal: 22 mg/dL (ref 5–40)

## 2023-09-24 LAB — CBC
Hematocrit: 45.4 % (ref 37.5–51.0)
Hemoglobin: 15.5 g/dL (ref 13.0–17.7)
MCH: 29.4 pg (ref 26.6–33.0)
MCHC: 34.1 g/dL (ref 31.5–35.7)
MCV: 86 fL (ref 79–97)
Platelets: 375 10*3/uL (ref 150–450)
RBC: 5.27 x10E6/uL (ref 4.14–5.80)
RDW: 12.7 % (ref 11.6–15.4)
WBC: 6.8 10*3/uL (ref 3.4–10.8)

## 2023-09-24 LAB — TSH+FREE T4
Free T4: 1.51 ng/dL (ref 0.82–1.77)
TSH: 2.44 u[IU]/mL (ref 0.450–4.500)

## 2023-09-24 MED ORDER — LEVOTHYROXINE SODIUM 50 MCG PO TABS: ORAL_TABLET | ORAL | 3 refills | Status: DC

## 2023-09-24 MED ORDER — ALBUTEROL SULFATE HFA 108 (90 BASE) MCG/ACT IN AERS: 1.0000 | INHALATION_SPRAY | Freq: Four times a day (QID) | RESPIRATORY_TRACT | 1 refills | Status: AC | PRN

## 2023-09-24 MED ORDER — ESCITALOPRAM OXALATE 10 MG PO TABS: 5.0000 mg | ORAL_TABLET | Freq: Every day | ORAL | 2 refills | Status: DC

## 2023-09-24 NOTE — Progress Notes (Signed)
Results sent through MyChart

## 2023-12-20 ENCOUNTER — Other Ambulatory Visit: Payer: Self-pay | Admitting: Medical

## 2024-04-27 ENCOUNTER — Ambulatory Visit: Admitting: Medical

## 2024-04-27 VITALS — BP 120/80 | HR 61 | Temp 98.4°F | Ht 68.0 in | Wt 216.6 lb

## 2024-04-27 DIAGNOSIS — Z789 Other specified health status: Secondary | ICD-10-CM | POA: Diagnosis not present

## 2024-04-27 DIAGNOSIS — R109 Unspecified abdominal pain: Secondary | ICD-10-CM | POA: Diagnosis not present

## 2024-04-27 DIAGNOSIS — E782 Mixed hyperlipidemia: Secondary | ICD-10-CM

## 2024-04-27 DIAGNOSIS — R1012 Left upper quadrant pain: Secondary | ICD-10-CM

## 2024-04-27 DIAGNOSIS — Z79899 Other long term (current) drug therapy: Secondary | ICD-10-CM

## 2024-04-27 NOTE — Progress Notes (Signed)
 Subjective:  Walter Spears is a 28 y.o. male who presents for Chief Complaint  Patient presents with   Abdominal Pain     Here for complaint about abdominal pain.  He notes left-sided pain just under the rib cage since May 2025.  It is a fairly consistent pressure pain.  Not worsened with eating.  It is just kind of there.  No injury, no trauma, no fall.  No change in bowels or urine.  No cough or congestion or respiratory symptoms.  No nausea, vomiting, blood in the stool.  No diarrhea or constipation.  No jaundice or other skin changes.  Otherwise in normal state of health.  He is compliant with fenofibrate .  He wonders about side effects from that.  When he first started taking fenofibrate  several years ago he had muscle aches but that resolved within a few weeks.  He does drink some alcohol.  He drank 2 mixed drinks this past weekend and sometime in the last week a couple beers.  He drinks occasionally.  No other aggravating or relieving factors.    No other c/o.  Past Medical History:  Diagnosis Date   Asthma    Depression    Dyslipidemia with elevated low density lipoprotein (LDL) cholesterol and abnormally low high density lipoprotein (HDL) cholesterol 12/02/2020   Environmental and seasonal allergies    History of sinusitis 11/21/2017   Hypertriglyceridemia    Left varicocele 11/21/2017   Thyroid  disease    Current Outpatient Medications on File Prior to Visit  Medication Sig Dispense Refill   albuterol  (VENTOLIN  HFA) 108 (90 Base) MCG/ACT inhaler Inhale 1-2 puffs into the lungs every 6 (six) hours as needed. 8 g 1   escitalopram  (LEXAPRO ) 10 MG tablet Take 0.5 tablets (5 mg total) by mouth daily. 90 tablet 2   fenofibrate  (TRICOR ) 145 MG tablet TAKE 1 TABLET BY MOUTH EVERY DAY 90 tablet 1   levothyroxine  (SYNTHROID ) 50 MCG tablet TAKE 1 TABLET(50 MCG) BY MOUTH DAILY BEFORE BREAKFAST 90 tablet 3   No current facility-administered medications on file prior to visit.      The following portions of the patient's history were reviewed and updated as appropriate: allergies, current medications, past family history, past medical history, past social history, past surgical history and problem list.  ROS Otherwise as in subjective above  Objective: BP 120/80   Pulse 61   Temp 98.4 F (36.9 C)   Ht 5' 8 (1.727 m)   Wt 216 lb 9.6 oz (98.2 kg)   SpO2 97%   BMI 32.93 kg/m   Wt Readings from Last 3 Encounters:  04/27/24 216 lb 9.6 oz (98.2 kg)  09/23/23 220 lb 6.4 oz (100 kg)  02/27/23 234 lb (106.1 kg)    General appearance: alert, no distress, well developed, well nourished Heart: RRR, normal S1, S2, no murmurs Lungs: CTA bilaterally, no wheezes, rhonchi, or rales Abdomen: +bs, soft, non tender, non distended, no masses, no hepatomegaly, no splenomegaly Pulses: 2+ radial pulses, 2+ pedal pulses, normal cap refill Ext: no edema   Assessment: Encounter Diagnoses  Name Primary?   LUQ abdominal pain Yes   Side pain    Consumes alcohol    Mixed hyperlipidemia    High risk medication use      Plan: We discussed possible causes.  I suspect pancreas inflammation given history of elevated triglycerides and alcohol use.  We discussed other differential below.  Labs as below.  Will likely pursue CT abdomen.  Advised  today and tomorrow to use real small portions that are not greasy or fatty, avoid cheese and alcohol in the next several days, hydrate well and rest.  We will call with lab results and possible pursuit of scan.  For now continue fenofibrate .   Walter Spears was seen today for abdominal pain.  Diagnoses and all orders for this visit:  LUQ abdominal pain -     Comprehensive metabolic panel with GFR -     CBC -     Lipase -     CK  Side pain -     Comprehensive metabolic panel with GFR -     CBC -     Lipase -     CK  Consumes alcohol  Mixed hyperlipidemia  High risk medication use -     Comprehensive metabolic panel with  GFR -     CBC -     CK    Follow up: pending labs

## 2024-04-28 ENCOUNTER — Ambulatory Visit: Payer: Self-pay | Admitting: Medical

## 2024-04-28 ENCOUNTER — Other Ambulatory Visit: Payer: Self-pay | Admitting: Medical

## 2024-04-28 DIAGNOSIS — R1012 Left upper quadrant pain: Secondary | ICD-10-CM

## 2024-04-28 LAB — COMPREHENSIVE METABOLIC PANEL WITH GFR
ALT: 25 IU/L (ref 0–44)
AST: 27 IU/L (ref 0–40)
Albumin: 4.9 g/dL (ref 4.3–5.2)
Alkaline Phosphatase: 40 IU/L — ABNORMAL LOW (ref 44–121)
BUN/Creatinine Ratio: 8 — ABNORMAL LOW (ref 9–20)
BUN: 8 mg/dL (ref 6–20)
Bilirubin Total: 0.5 mg/dL (ref 0.0–1.2)
CO2: 20 mmol/L (ref 20–29)
Calcium: 9.7 mg/dL (ref 8.7–10.2)
Chloride: 100 mmol/L (ref 96–106)
Creatinine, Ser: 1.06 mg/dL (ref 0.76–1.27)
Globulin, Total: 2.3 g/dL (ref 1.5–4.5)
Glucose: 101 mg/dL — ABNORMAL HIGH (ref 70–99)
Potassium: 4.4 mmol/L (ref 3.5–5.2)
Sodium: 138 mmol/L (ref 134–144)
Total Protein: 7.2 g/dL (ref 6.0–8.5)
eGFR: 97 mL/min/1.73 (ref >59)

## 2024-04-28 LAB — CBC
Hematocrit: 45.9 % (ref 37.5–51.0)
Hemoglobin: 15.3 g/dL (ref 13.0–17.7)
MCH: 29.3 pg (ref 26.6–33.0)
MCHC: 33.3 g/dL (ref 31.5–35.7)
MCV: 88 fL (ref 79–97)
Platelets: 360 x10E3/uL (ref 150–450)
RBC: 5.23 x10E6/uL (ref 4.14–5.80)
RDW: 12.9 % (ref 11.6–15.4)
WBC: 5.2 x10E3/uL (ref 3.4–10.8)

## 2024-04-28 LAB — LIPASE: Lipase: 21 U/L (ref 13–78)

## 2024-04-28 LAB — CK: Total CK: 148 U/L (ref 49–439)

## 2024-04-28 NOTE — Progress Notes (Signed)
Results sent through mychart 

## 2024-04-29 ENCOUNTER — Telehealth: Payer: Self-pay | Admitting: Medical

## 2024-04-29 NOTE — Telephone Encounter (Signed)
 Copied from CRM [id number]. Topic: General - Other >> Apr 29, 2024 11:48 AM Gustabo D wrote: Patient wants to know if the CT scan will be covered by his insurance.  Spoke to patient and advised him that he would need to contact insurance with cpt codes and dx codes that he can get from imaging facility and then call his insurance and they can tell him his cost for that

## 2024-04-30 ENCOUNTER — Ambulatory Visit: Admitting: Medical

## 2024-05-01 ENCOUNTER — Ambulatory Visit (HOSPITAL_BASED_OUTPATIENT_CLINIC_OR_DEPARTMENT_OTHER): Payer: Self-pay

## 2024-05-26 ENCOUNTER — Telehealth: Payer: Self-pay | Admitting: Medical

## 2024-05-26 NOTE — Telephone Encounter (Signed)
 Copied from CRM [id number]. Topic: Appointments - Transfer of Care >> May 26, 2024  3:24 PM Shereese L wrote: Pt is requesting to transfer FROM: BULAH, DAVID S [[id number]] Pt is requesting to transfer TO: Jacques Copland Reason for requested transfer: relocated It is the responsibility of the team the patient would like to transfer to (Dr. Jacques Copland) to reach out to the patient if for any reason this transfer is not acceptable.

## 2024-06-19 ENCOUNTER — Other Ambulatory Visit: Payer: Self-pay | Admitting: Medical

## 2024-09-18 ENCOUNTER — Other Ambulatory Visit: Payer: Self-pay | Admitting: Medical

## 2024-09-18 NOTE — Telephone Encounter (Signed)
 Has new pt appt next week with Chrisman. Will send in 30 days

## 2024-09-23 ENCOUNTER — Encounter: Payer: Self-pay | Admitting: Family Medicine

## 2024-09-23 NOTE — Progress Notes (Signed)
 Walter Campanella T. Walter Cavendish, MD, CAQ Sports Medicine [facility] [address] [place], [address]  Phone: [phone]  FAX: [phone]  DEITRICK FERRERI - 28 y.o. male  MRN [id number]  Date of Birth: 07-Aug-1996  Date: 09/24/2024  PCP: Watt Mirza, MD  Referral: No ref. provider found  Chief Complaint  Patient presents with   Establish Care   Subjective:   Walter Spears is a 28 y.o. very pleasant male patient with Body mass index is 33.13 kg/m. who presents with the following:  Discussed the use of AI scribe software for clinical note transcription with the patient, who gave verbal consent to proceed.  Patient is 28 years old, he presents as a new patient office visit for me.  He has a history of hyperlipidemia, hypothyroidism.  He is currently on Synthroid  50 mcg.  He also has a history of anxiety and depression, he is currently on Lexapro  10 mg.  He also has a history of hyperlipidemia and hypertriglyceridemia, he is currently on Tricor  145 mg.  Mild intermittent asthma, no daily medication, but he does have some fendiline to use as needed  Depression / anxiety:  on Lexapro  5 mg for 1 year History of Present Illness Walter Spears is a 28 year old male who presents for a new patient appointment.  He has a history of hyperlipidemia and is currently taking fenofibrate  (Tricor ). His LDL levels have decreased, although his HDL remains low despite losing 37 pounds over the past couple of years. He engages in regular physical activity, walking three miles around the neighborhood at least four days a week, but has not been doing intense cardio recently.  He was diagnosed with hypothyroidism in 2018 after a lab test showed a slightly abnormal thyroid  level. He has been on 50 micrograms of thyroid  medication since then, which he takes first thing in the morning. He questions the necessity of this medication as the initial abnormality was  minimal.  He has been on Lexapro  for depression and anxiety for ten years, currently taking 5 mg daily. He has attempted to discontinue the medication but experiences significant tremors when trying to stop. He reports significant improvement in his mental health over the years and no longer feels depressed or anxious. He has a family history of mental health issues, including depression and bipolar disorder.  He reports a history of rib pain following a severe cough in December 2023, which he believes may have caused a rib injury. The pain recurred in the summer after turning the wrong way. He describes the pain as localized to the chest area, with a sensation of something 'flapping around.'  Light was previously normal.  He has a history of asthma.  Previously, he experienced wheezing and shortness of breath during exercise and pollen exposure, but he no longer has these symptoms and does not use daily inhalers. No current shortness of breath or wheezing.    Review of Systems is noted in the HPI, as appropriate  Objective:   BP 114/76   Pulse (!) 58   Temp 98.3 F (36.8 C) (Temporal)   Ht 5' 6.5 (1.689 m)   Wt 208 lb 6 oz (94.5 kg)   SpO2 99%   BMI 33.13 kg/m   GEN: No acute distress; alert,appropriate. CV: RRR, no m/g/r  PULM: Normal respiratory rate, no accessory muscle use. No wheezes, crackles or rhonchi  PSYCH: Normally interactive.   Mild pain in the floating  ribs on the left to deep palpation  Laboratory and Imaging Data:  Assessment and Plan:     ICD-10-CM   1. Mixed hyperlipidemia  E78.2     2. Anxiety with depression  F41.8     3. Acquired hypothyroidism  E03.9 TSH    T4, free    T3, free    4. Obesity (BMI 30-39.9)  E66.9     5. Mild intermittent asthma without complication  J45.20     6. Seasonal allergic rhinitis due to pollen  J30.1     7. Screening for diabetes mellitus  Z13.1 Basic metabolic panel with GFR    Hemoglobin A1c    8.  Hypertriglyceridemia  E78.1 Lipid panel    9. Encounter for long-term (current) use of medications  Z79.899 Basic metabolic panel with GFR    CBC with Differential/Platelet    Hepatic function panel     Assessment & Plan Mixed hyperlipidemia Elevated triglycerides and LDL, low HDL. Fenofibrate  effective for triglycerides and LDL. HDL limited by genetics and exercise. - Encouraged regular aerobic exercise.  Depression with anxiety Managed with Lexapro  for 10 years. Previous withdrawal symptoms on discontinuation. Discussed conservative tapering approach. - Initiated Lexapro  taper: 2.5 mg daily for one month, then 2.5 mg every other day for one month. - Monitor for depression or withdrawal symptoms.  Acquired hypothyroidism Diagnosed in 2018. Currently on 50 mcg thyroid  medication. Discussed discontinuation and re-evaluation. Minimal risk with discontinuation. - Discontinued thyroid  medication. - Recheck thyroid  levels in 6-8 weeks.  Obesity 37-pound weight loss through increased activity and healthier eating. - Continue regular physical activity and healthy eating.  Rib pain due to costochondral injury Intermittent rib pain from costochondral injury.  Likely cartilaginous injury from coughing, and he has also injured this again. - Use ibuprofen as needed.   Medication Management during today's office visit: Meds ordered this encounter  Medications   escitalopram  (LEXAPRO ) 5 MG tablet    Sig: Take 1 tablet (5 mg total) by mouth daily.    Dispense:  30 tablet    Refill:  2   Medications Discontinued During This Encounter  Medication Reason   escitalopram  (LEXAPRO ) 10 MG tablet    levothyroxine  (SYNTHROID ) 50 MCG tablet     Orders placed today for conditions managed today: Orders Placed This Encounter  Procedures   Basic metabolic panel with GFR   CBC with Differential/Platelet   Hepatic function panel   Hemoglobin A1c   Lipid panel   TSH   T4, free   T3, free     Disposition: Return in about 2 months (around 11/25/2024) for Fasting lab appointment.  [employer] speech-to-text software was used for transcription in this dictation.  Possible transcriptional errors can occur using Animal nutritionist.   Signed,  Jacques DASEN. Keyunna Coco, MD   Outpatient Encounter Medications as of 09/24/2024  Medication Sig   albuterol  (VENTOLIN  HFA) 108 (90 Base) MCG/ACT inhaler Inhale 1-2 puffs into the lungs every 6 (six) hours as needed.   escitalopram  (LEXAPRO ) 5 MG tablet Take 1 tablet (5 mg total) by mouth daily.   fenofibrate  (TRICOR ) 145 MG tablet TAKE 1 TABLET BY MOUTH EVERY DAY   [DISCONTINUED] escitalopram  (LEXAPRO ) 10 MG tablet Take 0.5 tablets (5 mg total) by mouth daily.   [DISCONTINUED] levothyroxine  (SYNTHROID ) 50 MCG tablet TAKE 1 TABLET(50 MCG) BY MOUTH DAILY BEFORE BREAKFAST   No facility-administered encounter medications on file as of 09/24/2024.

## 2024-09-24 ENCOUNTER — Encounter: Payer: Self-pay | Admitting: Family Medicine

## 2024-09-24 ENCOUNTER — Ambulatory Visit: Admitting: Family Medicine

## 2024-09-24 VITALS — BP 114/76 | HR 58 | Temp 98.3°F | Ht 66.5 in | Wt 208.4 lb

## 2024-09-24 DIAGNOSIS — F418 Other specified anxiety disorders: Secondary | ICD-10-CM | POA: Diagnosis not present

## 2024-09-24 DIAGNOSIS — E039 Hypothyroidism, unspecified: Secondary | ICD-10-CM | POA: Diagnosis not present

## 2024-09-24 DIAGNOSIS — J4520 Mild intermittent asthma, uncomplicated: Secondary | ICD-10-CM

## 2024-09-24 DIAGNOSIS — Z131 Encounter for screening for diabetes mellitus: Secondary | ICD-10-CM | POA: Diagnosis not present

## 2024-09-24 DIAGNOSIS — E669 Obesity, unspecified: Secondary | ICD-10-CM | POA: Diagnosis not present

## 2024-09-24 DIAGNOSIS — J301 Allergic rhinitis due to pollen: Secondary | ICD-10-CM | POA: Diagnosis not present

## 2024-09-24 DIAGNOSIS — E781 Pure hyperglyceridemia: Secondary | ICD-10-CM

## 2024-09-24 DIAGNOSIS — E782 Mixed hyperlipidemia: Secondary | ICD-10-CM | POA: Diagnosis not present

## 2024-09-24 DIAGNOSIS — Z79899 Other long term (current) drug therapy: Secondary | ICD-10-CM | POA: Diagnosis not present

## 2024-09-24 MED ORDER — ESCITALOPRAM OXALATE 5 MG PO TABS: 5.0000 mg | ORAL_TABLET | Freq: Every day | ORAL | 2 refills | Status: AC

## 2024-09-24 NOTE — Patient Instructions (Addendum)
 Lexapro  5 mg tablets, cut the tablet in half to 2.5 mg everyday for a month.  After one month, take 2.5 mg (1/2 tablet) every other day for a month  Stop taking Levothyroxine , and we can recheck levels in 2 months

## 2024-09-25 ENCOUNTER — Encounter: Payer: BC Managed Care – PPO | Admitting: Medical

## 2024-10-14 ENCOUNTER — Other Ambulatory Visit: Payer: Self-pay | Admitting: Medical

## 2024-11-25 ENCOUNTER — Other Ambulatory Visit
